# Patient Record
Sex: Male | Born: 1935 | Race: White | Hispanic: No | Marital: Married | Smoking: Former smoker
Health system: Southern US, Community
[De-identification: ages and names within clinical notes are randomized; demographics above are authoritative.]

## PROBLEM LIST (undated history)

## (undated) DIAGNOSIS — I10 Essential (primary) hypertension: Secondary | ICD-10-CM

## (undated) DIAGNOSIS — I2510 Atherosclerotic heart disease of native coronary artery without angina pectoris: Secondary | ICD-10-CM

## (undated) DIAGNOSIS — K5790 Diverticulosis of intestine, part unspecified, without perforation or abscess without bleeding: Secondary | ICD-10-CM

## (undated) DIAGNOSIS — Q438 Other specified congenital malformations of intestine: Secondary | ICD-10-CM

## (undated) DIAGNOSIS — E785 Hyperlipidemia, unspecified: Secondary | ICD-10-CM

## (undated) DIAGNOSIS — D369 Benign neoplasm, unspecified site: Secondary | ICD-10-CM

## (undated) DIAGNOSIS — C4490 Unspecified malignant neoplasm of skin, unspecified: Secondary | ICD-10-CM

## (undated) HISTORY — DX: Hyperlipidemia, unspecified: E78.5

## (undated) HISTORY — DX: Other specified congenital malformations of intestine: Q43.8

## (undated) HISTORY — DX: Benign neoplasm, unspecified site: D36.9

## (undated) HISTORY — DX: Diverticulosis of intestine, part unspecified, without perforation or abscess without bleeding: K57.90

## (undated) HISTORY — DX: Unspecified malignant neoplasm of skin, unspecified: C44.90

## (undated) HISTORY — DX: Atherosclerotic heart disease of native coronary artery without angina pectoris: I25.10

---

## 1992-04-18 HISTORY — PX: CORONARY ARTERY BYPASS GRAFT: SHX141

## 1999-12-29 ENCOUNTER — Encounter: Admission: RE | Admit: 1999-12-29 | Discharge: 1999-12-29 | Payer: Self-pay | Admitting: Cardiology

## 1999-12-29 ENCOUNTER — Encounter: Payer: Self-pay | Admitting: Cardiology

## 2000-01-04 ENCOUNTER — Ambulatory Visit (HOSPITAL_COMMUNITY): Admission: RE | Admit: 2000-01-04 | Discharge: 2000-01-04 | Payer: Self-pay | Admitting: Cardiology

## 2002-09-06 ENCOUNTER — Ambulatory Visit (HOSPITAL_COMMUNITY): Admission: RE | Admit: 2002-09-06 | Discharge: 2002-09-06 | Payer: Self-pay | Admitting: Family Medicine

## 2002-09-06 ENCOUNTER — Encounter: Payer: Self-pay | Admitting: Family Medicine

## 2002-10-31 ENCOUNTER — Ambulatory Visit (HOSPITAL_COMMUNITY): Admission: RE | Admit: 2002-10-31 | Discharge: 2002-10-31 | Payer: Self-pay | Admitting: Internal Medicine

## 2002-11-21 ENCOUNTER — Encounter: Payer: Self-pay | Admitting: Internal Medicine

## 2002-11-21 ENCOUNTER — Ambulatory Visit (HOSPITAL_COMMUNITY): Admission: RE | Admit: 2002-11-21 | Discharge: 2002-11-21 | Payer: Self-pay | Admitting: Internal Medicine

## 2003-09-16 ENCOUNTER — Ambulatory Visit (HOSPITAL_COMMUNITY): Admission: RE | Admit: 2003-09-16 | Discharge: 2003-09-16 | Payer: Self-pay | Admitting: Family Medicine

## 2004-07-08 ENCOUNTER — Ambulatory Visit (HOSPITAL_COMMUNITY): Admission: RE | Admit: 2004-07-08 | Discharge: 2004-07-08 | Payer: Self-pay | Admitting: Family Medicine

## 2004-08-12 ENCOUNTER — Ambulatory Visit (HOSPITAL_COMMUNITY): Admission: RE | Admit: 2004-08-12 | Discharge: 2004-08-12 | Payer: Self-pay | Admitting: Family Medicine

## 2007-01-19 ENCOUNTER — Ambulatory Visit (HOSPITAL_COMMUNITY): Admission: RE | Admit: 2007-01-19 | Discharge: 2007-01-19 | Payer: Self-pay | Admitting: Internal Medicine

## 2007-10-09 ENCOUNTER — Ambulatory Visit (HOSPITAL_COMMUNITY): Admission: RE | Admit: 2007-10-09 | Discharge: 2007-10-09 | Payer: Self-pay | Admitting: Family Medicine

## 2008-01-30 ENCOUNTER — Ambulatory Visit: Payer: Self-pay | Admitting: Cardiology

## 2008-04-18 DIAGNOSIS — Q438 Other specified congenital malformations of intestine: Secondary | ICD-10-CM

## 2008-04-18 DIAGNOSIS — D369 Benign neoplasm, unspecified site: Secondary | ICD-10-CM

## 2008-04-18 DIAGNOSIS — K5790 Diverticulosis of intestine, part unspecified, without perforation or abscess without bleeding: Secondary | ICD-10-CM

## 2008-04-18 HISTORY — DX: Diverticulosis of intestine, part unspecified, without perforation or abscess without bleeding: K57.90

## 2008-04-18 HISTORY — DX: Benign neoplasm, unspecified site: D36.9

## 2008-04-18 HISTORY — DX: Other specified congenital malformations of intestine: Q43.8

## 2008-10-10 ENCOUNTER — Encounter (INDEPENDENT_AMBULATORY_CARE_PROVIDER_SITE_OTHER): Payer: Self-pay | Admitting: *Deleted

## 2008-10-10 LAB — CONVERTED CEMR LAB
Albumin: 4.2 g/dL
BUN: 12 mg/dL
Bilirubin, Direct: 0.2 mg/dL
Bilirubin, Direct: 0.2 mg/dL
CO2: 28 meq/L
CO2: 28 meq/L
Calcium: 9.1 mg/dL
Cholesterol: 94 mg/dL
Creatinine, Ser: 1.14 mg/dL
Creatinine, Ser: 1.14 mg/dL
Free T4: 7.9 ng/dL
Free T4: 7.9 ng/dL
Glucose, Bld: 95 mg/dL
HDL: 48 mg/dL
LDL Cholesterol: 33 mg/dL
Platelets: 138 10*3/uL
Potassium: 4 meq/L
Sodium: 143 meq/L
Sodium: 143 meq/L
Total Protein: 6.5 g/dL

## 2008-10-16 ENCOUNTER — Encounter: Payer: Self-pay | Admitting: Internal Medicine

## 2008-10-29 ENCOUNTER — Encounter: Payer: Self-pay | Admitting: Internal Medicine

## 2008-10-29 ENCOUNTER — Ambulatory Visit (HOSPITAL_COMMUNITY): Admission: RE | Admit: 2008-10-29 | Discharge: 2008-10-29 | Payer: Self-pay | Admitting: Internal Medicine

## 2008-10-29 ENCOUNTER — Ambulatory Visit: Payer: Self-pay | Admitting: Internal Medicine

## 2008-10-29 HISTORY — PX: COLONOSCOPY: SHX174

## 2008-10-31 ENCOUNTER — Encounter: Payer: Self-pay | Admitting: Internal Medicine

## 2008-11-04 ENCOUNTER — Telehealth (INDEPENDENT_AMBULATORY_CARE_PROVIDER_SITE_OTHER): Payer: Self-pay | Admitting: *Deleted

## 2008-11-12 ENCOUNTER — Ambulatory Visit (HOSPITAL_COMMUNITY): Admission: RE | Admit: 2008-11-12 | Discharge: 2008-11-12 | Payer: Self-pay | Admitting: Internal Medicine

## 2008-11-19 ENCOUNTER — Encounter: Payer: Self-pay | Admitting: Internal Medicine

## 2008-12-09 ENCOUNTER — Encounter: Admission: RE | Admit: 2008-12-09 | Discharge: 2008-12-09 | Payer: Self-pay | Admitting: Internal Medicine

## 2008-12-09 HISTORY — PX: COLONOSCOPY: SHX174

## 2009-02-02 ENCOUNTER — Ambulatory Visit: Payer: Self-pay | Admitting: Cardiology

## 2009-02-02 DIAGNOSIS — I10 Essential (primary) hypertension: Secondary | ICD-10-CM | POA: Insufficient documentation

## 2009-02-02 DIAGNOSIS — I2510 Atherosclerotic heart disease of native coronary artery without angina pectoris: Secondary | ICD-10-CM

## 2009-02-02 DIAGNOSIS — E782 Mixed hyperlipidemia: Secondary | ICD-10-CM

## 2009-03-02 ENCOUNTER — Ambulatory Visit: Payer: Self-pay | Admitting: Cardiology

## 2009-04-06 ENCOUNTER — Ambulatory Visit: Payer: Self-pay | Admitting: Cardiology

## 2009-04-14 ENCOUNTER — Encounter: Payer: Self-pay | Admitting: Cardiology

## 2009-05-01 ENCOUNTER — Encounter (INDEPENDENT_AMBULATORY_CARE_PROVIDER_SITE_OTHER): Payer: Self-pay | Admitting: *Deleted

## 2009-05-06 ENCOUNTER — Ambulatory Visit: Payer: Self-pay | Admitting: Cardiology

## 2009-07-06 ENCOUNTER — Telehealth (INDEPENDENT_AMBULATORY_CARE_PROVIDER_SITE_OTHER): Payer: Self-pay

## 2009-07-24 ENCOUNTER — Encounter (INDEPENDENT_AMBULATORY_CARE_PROVIDER_SITE_OTHER): Payer: Self-pay

## 2009-08-04 ENCOUNTER — Encounter: Payer: Self-pay | Admitting: Cardiology

## 2009-08-05 LAB — CONVERTED CEMR LAB
AST: 19 units/L (ref 0–37)
Total Bilirubin: 0.6 mg/dL (ref 0.3–1.2)
Total Protein: 6.2 g/dL (ref 6.0–8.3)
VLDL: 13 mg/dL (ref 0–40)

## 2009-08-17 ENCOUNTER — Ambulatory Visit (HOSPITAL_COMMUNITY): Admission: RE | Admit: 2009-08-17 | Discharge: 2009-08-17 | Payer: Self-pay | Admitting: Family Medicine

## 2009-11-18 ENCOUNTER — Ambulatory Visit: Payer: Self-pay | Admitting: Cardiology

## 2009-11-19 ENCOUNTER — Encounter: Payer: Self-pay | Admitting: Cardiology

## 2010-02-09 ENCOUNTER — Encounter: Payer: Self-pay | Admitting: Cardiology

## 2010-02-09 LAB — CONVERTED CEMR LAB
Albumin: 4.4 g/dL (ref 3.5–5.2)
Cholesterol: 108 mg/dL (ref 0–200)
HDL: 43 mg/dL (ref >39)
Indirect Bilirubin: 0.3 mg/dL (ref 0.0–0.9)
LDL Cholesterol: 48 mg/dL (ref 0–99)
Total CHOL/HDL Ratio: 2.5
Total Protein: 6.5 g/dL (ref 6.0–8.3)
Triglycerides: 87 mg/dL (ref <150)
VLDL: 17 mg/dL (ref 0–40)

## 2010-05-18 NOTE — Progress Notes (Signed)
Summary: Medicines  Phone Note Call from Patient   Caller: Patient Reason for Call: Talk to Nurse Summary of Call: pt wants to speak with nurse regarding medicines being called in/pl call pt/tg Initial call taken by: Raechel Ache [facility],  July 06, 2009 10:48 AM  Follow-up for Phone Call        Pt. states that he only wants his refills when he request them. I advised him to call his phar. and ask them to only send out refill request at the begining of each month. He states he understands info. given. Follow-up by: Larita Fife Via LPN,  July 06, 2009 11:10 AM

## 2010-05-18 NOTE — Assessment & Plan Note (Signed)
Summary: A2Z   Visit Type:  Follow-up Primary Provider:  Dr. Butch Penny   History of Present Illness: 77 year old male presents for followup. He reports no problems with angina. Describes NYHA class II dyspnea on exertion.  Recent followup labs from April of this year showed cholesterol 94, triglycerides of 65, HDL 46, LDL 35, AST 19, ALT 15. Statin dosing was reduced. He had some question earlier about the possibility of urinary symptoms following a switch from Vytorin to simvastatin, however this did not turn out to be the case. He is now interested in switching back to simvastatin.  Current Medications (verified): 1)  Aspir-Low 81 Mg Tbec (Aspirin) .... Take 1 Tab Daily 2)  Daily-Vitamin  Tabs (Multiple Vitamin) .... Take 1 Tab Daily 3)  Mucinex D 60-600 Mg Xr12h-Tab (Pseudoephedrine-Guaifenesin) .... Take 1 Tab Daily 4)  Proair Hfa 108 (90 Base) Mcg/act Aers (Albuterol Sulfate) .... As Needed 5)  Amlodipine Besylate 10 Mg Tabs (Amlodipine Besylate) .... Take Two Tablets By Mouth Daily 6)  Labetalol Hcl 200 Mg Tabs (Labetalol Hcl) .... Take 1 Tablet By Mouth Two Times A Day 7)  Nitrostat 0.4 Mg Subl (Nitroglycerin) .Marland Kitchen.. 1 Tablet Under Tongue At Onset of Chest Pain; You May Repeat Every 5 Minutes For Up To 3 Doses. 8)  Simvastatin 10 Mg Tabs (Simvastatin) .... Take 1 Tablet By Mouth At Bedtime  Allergies (verified): No Known Drug Allergies  Past History:  Past Surgical History: Last updated: 02/02/2009 CABG - LIMA to LAD, SVG to first and second diagonal, SVG to OM, SVG to PDA  Social History: Last updated: 02/02/2009 Retired - [employer] Married  Tobacco Use - Former.  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  The patient denies anorexia, fever, chest pain, syncope, peripheral edema, melena, and hematochezia.         Otherwise reviewed and  negative except as outlined.  Vital Signs:  Patient profile:   77 year old male Weight:      157 pounds BMI:     24.68 Pulse rate:   68 / minute BP sitting:   157 / 82  (right arm)  Vitals Entered By: Dreama Saa, CNA (November 18, 2009 2:45 PM)  Physical Exam  Additional Exam:  Elderly male in no acute distress. HEENT: conjunctiva and lids normal, oropharynx with moist mucosa. Neck: Supple, without carotid bruits or thyromegaly. Lungs: Clear with diminished breath sounds. No wheezing. Nonlabored. Cardiac: Distant, regular heart sounds, soft basal systolic murmur, no S3 gallop. Abdomen: Soft, nontender, no bruit. Extremities: No pitting edema, distal pulses 1-2+. Skin: Warm and dry. Musculoskeletal: Mild kyphosis. Neuropsychiatric: Alert and oriented x3, affect normal.   EKG  Procedure date:  11/18/2009  Findings:      Sinus rhythm at 62 beats per minute with prolonged PR interval of 216 ms and IVCD with QRS duration 146 ms. Incomplete right bundle branch block pattern with evidence of previous anterior infarct.  Impression & Recommendations:  Problem # 1:  CORONARY ATHEROSCLEROSIS NATIVE CORONARY ARTERY (ICD-414.01)  His updated medication list for this problem includes:    Aspir-low 81 Mg Tbec (Aspirin) .Marland Kitchen... Take 1 tab daily    Amlodipine Besylate 10 Mg Tabs (Amlodipine besylate) .Marland Kitchen... Take two tablets by mouth daily    Labetalol Hcl 200 Mg Tabs (Labetalol hcl) .Marland Kitchen... Take 1 tablet by mouth two times  a day    Nitrostat 0.4 Mg Subl (Nitroglycerin) .Marland Kitchen... 1 tablet under tongue at onset of chest pain; you may repeat every 5 minutes for up to 3 doses.  His updated medication list for this problem includes:    Aspir-low 81 Mg Tbec (Aspirin) .Marland Kitchen... Take 1 tab daily    Amlodipine Besylate 10 Mg Tabs (Amlodipine besylate) .Marland Kitchen... Take two tablets by mouth daily    Labetalol Hcl 200 Mg Tabs (Labetalol hcl) .Marland Kitchen... Take 1 tablet by mouth two times a day    Nitrostat 0.4 Mg Subl  (Nitroglycerin) .Marland Kitchen... 1 tablet under tongue at onset of chest pain; you may repeat every 5 minutes for up to 3 doses.  Problem # 2:  MIXED HYPERLIPIDEMIA (ICD-272.2)  Agree with switch from Vytorin to simvastatin 10 mg p.o. q.h.s. Refills provided. Followup lipid profile and liver function tests at 12 weeks.  His updated medication list for this problem includes:    Simvastatin 10 Mg Tabs (Simvastatin) .Marland Kitchen... Take 1 tablet by mouth at bedtime  Future Orders: T-Lipid Profile ([id number]) ... 02/08/2010 T-Hepatic Function [id number]) ... 02/08/2010  Problem # 3:  ESSENTIAL HYPERTENSION, BENIGN (ICD-401.1)  Blood pressure elevated today.  His updated medication list for this problem includes:    Aspir-low 81 Mg Tbec (Aspirin) .Marland Kitchen... Take 1 tab daily    Amlodipine Besylate 10 Mg Tabs (Amlodipine besylate) .Marland Kitchen... Take two tablets by mouth daily    Labetalol Hcl 200 Mg Tabs (Labetalol hcl) .Marland Kitchen... Take 1 tablet by mouth two times a day  His updated medication list for this problem includes:    Aspir-low 81 Mg Tbec (Aspirin) .Marland Kitchen... Take 1 tab daily    Amlodipine Besylate 10 Mg Tabs (Amlodipine besylate) .Marland Kitchen... Take two tablets by mouth daily    Labetalol Hcl 200 Mg Tabs (Labetalol hcl) .Marland Kitchen... Take 1 tablet by mouth two times a day  Patient Instructions: 1)  Your physician recommends that you schedule a follow-up appointment in: 6 months 2)  Your physician recommends that you return for lab work in: 12 weeks 3)  Your physician has recommended you make the following change in your medication: stop taking Vytorin and start taking Simvastatin 10mg  by mouth once daily  Prescriptions: SIMVASTATIN 10 MG TABS (SIMVASTATIN) take 1 tablet by mouth at bedtime  #30 x 5   Entered by:   Larita Fife Via LPN   Authorized by:   Loreli Slot, MD, [facility]   Signed by:   Larita Fife Via LPN on 29/56/2130   Method used:   Electronically to        [facility] (retail)       [address]        [name], [place]  [address]       Ph: [phone]       Fax: [phone]   RxID:   [id number]

## 2010-05-18 NOTE — Miscellaneous (Signed)
Summary: Medications update  Clinical Lists Changes  Medications: Removed medication of SIMVASTATIN 20 MG TABS (SIMVASTATIN) take 1 tablet by mouth at bedtime - Signed Added new medication of VYTORIN 10-40 MG TABS (EZETIMIBE-SIMVASTATIN) Take 1/2 tablet by mouth once daily - Signed    Pt. states that he stopped taking Simvastatin due to Kidney infection and restarted Vytorin 10-40mg  (1/2 tablet once daily).

## 2010-05-18 NOTE — Miscellaneous (Signed)
Summary: LABS 10/10/2008 CMP,LIPIDS,TSH  Clinical Lists Changes  Observations: Added new observation of ALBUMIN: 4.2 g/dL (16/01/9603 54:09) Added new observation of PROTEIN, TOT: 6.5 g/dL (81/19/1478 29:56) Added new observation of SGPT (ALT): 15 units/L (10/10/2008 16:58) Added new observation of SGOT (AST): 20 units/L (10/10/2008 16:58) Added new observation of ALK PHOS: 48 units/L (10/10/2008 16:58) Added new observation of BILI DIRECT: 0.2 mg/dL (21/30/8657 84:69) Added new observation of CREATININE: 1.14 mg/dL (62/95/2841 32:44) Added new observation of BUN: 12 mg/dL (04/20/7251 66:44) Added new observation of BG RANDOM: 95 mg/dL (03/47/4259 56:38) Added new observation of CO2 PLSM/SER: 28 meq/L (10/10/2008 16:58) Added new observation of CL SERUM: 103 meq/L (10/10/2008 16:58) Added new observation of K SERUM: 4.0 meq/L (10/10/2008 16:58) Added new observation of NA: 143 meq/L (10/10/2008 16:58) Added new observation of LDL: 33 mg/dL (75/64/3329 51:88) Added new observation of HDL: 48 mg/dL (41/66/0630 16:01) Added new observation of TRIGLYC TOT: 67 mg/dL (09/32/3557 32:20) Added new observation of CHOLESTEROL: 94 mg/dL (25/42/7062 37:62) Added new observation of TSH: 1.881 microintl units/mL (10/10/2008 16:58) Added new observation of T4, FREE: 7.9 ng/dL (83/15/1761 60:73)

## 2010-05-18 NOTE — Assessment & Plan Note (Signed)
Summary: 3 mth f/u per checkout on 02/02/09/tg   Visit Type:  Follow-up Primary Provider:  Dr. Butch Penny   History of Present Illness: 77 year old male presents for a followup visit. He denies any significant angina or progressive shortness of breath. He has had blood pressure checks and medication adjustments since I last saw him. New medication regimen is outlined below. He presents a list of home blood pressure checks from last week showing quite good blood pressure control, typically with systolics ranging in the 110-120 range over diastolics in the 70-80 range. Heart rate in the 60s.  He asked about changing Vytorin to a generic medication. He still has an old bottle of nitroglycerin, not requiring them for several years. We discussed refilling this as well.   Current Medications (verified): 1)  Aspir-Low 81 Mg Tbec (Aspirin) .... Take 1 Tab Daily 2)  Daily-Vitamin  Tabs (Multiple Vitamin) .... Take 1 Tab Daily 3)  Mucinex D 60-600 Mg Xr12h-Tab (Pseudoephedrine-Guaifenesin) .... Take 1 Tab Daily 4)  Proair Hfa 108 (90 Base) Mcg/act Aers (Albuterol Sulfate) .... As Needed 5)  Amlodipine Besylate 10 Mg Tabs (Amlodipine Besylate) .... Take Two Tablets By Mouth Daily 6)  Labetalol Hcl 200 Mg Tabs (Labetalol Hcl) .... Take 1 Tablet By Mouth Two Times A Day 7)  Simvastatin 20 Mg Tabs (Simvastatin) .... Take 1 Tablet By Mouth At Bedtime 8)  Nitrostat 0.4 Mg Subl (Nitroglycerin) .Marland Kitchen.. 1 Tablet Under Tongue At Onset of Chest Pain; You May Repeat Every 5 Minutes For Up To 3 Doses.  Allergies (verified): No Known Drug Allergies  Past History:  Past Medical History: Last updated: 02/02/2009 CAD - LVEF 60% Hyperlipidemia Hypertension  Past Surgical History: Last updated: 02/02/2009 CABG - LIMA to LAD, SVG to first and second diagonal, SVG to OM, SVG to PDA  Social History: Last updated: 02/02/2009 Retired - [employer] Married  Tobacco Use - Former.  Alcohol Use - no  Review of  Systems  The patient denies anorexia, fever, weight loss, chest pain, syncope, peripheral edema, prolonged cough, headaches, hemoptysis, melena, and hematochezia.         Otherwise reviewed and negative.  Vital Signs:  Patient profile:   77 year old male Weight:      159 pounds Pulse rate:   60 / minute BP sitting:   142 / 78  (right arm)  Vitals Entered By: Dreama Saa, CNA (May 06, 2009 11:34 AM)  Physical Exam  Additional Exam:  Elderly male in no acute distress. HEENT: conjunctiva and lids normal, oropharynx with moist mucosa. Neck: Supple, without carotid bruits or thyromegaly. Lungs: Clear with diminished breath sounds. No wheezing. Nonlabored. Cardiac: Distant, regular heart sounds, soft basal systolic murmur, no S3 gallop. Abdomen: Soft, nontender, no bruit. Extremities: No pitting edema, distal pulses 1-2+. Skin: Warm and dry. Musculoskeletal: Mild kyphosis. Neuropsychiatric: Alert and oriented x3, affect normal.   Impression & Recommendations:  Problem # 1:  CORONARY ATHEROSCLEROSIS NATIVE CORONARY ARTERY (ICD-414.01)  No active anginal symptoms on present medical regimen. Refill provided for sublingual nitroglycerin p.r.n. Follow up will be arranged in 6 months.  His updated medication list for this problem includes:    Aspir-low 81 Mg Tbec (Aspirin) .Marland Kitchen... Take 1 tab daily    Amlodipine Besylate 10 Mg Tabs (Amlodipine besylate) .Marland Kitchen... Take two tablets by mouth daily    Labetalol Hcl 200 Mg Tabs (Labetalol hcl) .Marland Kitchen... Take 1 tablet by mouth two times a day    Nitrostat 0.4 Mg Subl (Nitroglycerin) .Marland KitchenMarland KitchenMarland KitchenMarland Kitchen  1 tablet under tongue at onset of chest pain; you may repeat every 5 minutes for up to 3 doses.  His updated medication list for this problem includes:    Aspir-low 81 Mg Tbec (Aspirin) .Marland Kitchen... Take 1 tab daily    Amlodipine Besylate 10 Mg Tabs (Amlodipine besylate) .Marland Kitchen... Take two tablets by mouth daily    Labetalol Hcl 200 Mg Tabs (Labetalol hcl) .Marland Kitchen... Take 1  tablet by mouth two times a day    Nitrostat 0.4 Mg Subl (Nitroglycerin) .Marland Kitchen... 1 tablet under tongue at onset of chest pain; you may repeat every 5 minutes for up to 3 doses.  Problem # 2:  ESSENTIAL HYPERTENSION, BENIGN (ICD-401.1)  Blood pressure trend is much better in general. Norvasc dosing is a bit unconventional, however the patient is tolerating this with positive effects. I asked that he continue on his labetalol and Norvasc. He will continue to check his blood pressure at home.  His updated medication list for this problem includes:    Aspir-low 81 Mg Tbec (Aspirin) .Marland Kitchen... Take 1 tab daily    Amlodipine Besylate 10 Mg Tabs (Amlodipine besylate) .Marland Kitchen... Take two tablets by mouth daily    Labetalol Hcl 200 Mg Tabs (Labetalol hcl) .Marland Kitchen... Take 1 tablet by mouth two times a day  Problem # 3:  MIXED HYPERLIPIDEMIA (ICD-272.2)  Will discontinued Vytorin and initiate simvastatin 20 mg p.o. q.h.s. Followup lipid profile and liver function tests and 12 weeks.  The following medications were removed from the medication list:    Vytorin 10-40 Mg Tabs (Ezetimibe-simvastatin) .Marland Kitchen... Take 1/2 tab daily His updated medication list for this problem includes:    Simvastatin 20 Mg Tabs (Simvastatin) .Marland Kitchen... Take 1 tablet by mouth at bedtime  Future Orders: T-Lipid Profile ([id number]) ... 08/04/2009 T-Hepatic Function [id number]) ... 08/04/2009  Patient Instructions: 1)  Your physician recommends that you schedule a follow-up appointment in: 6 months 2)  Your physician recommends that you return for lab work in: 12 weeks (will mail orders to you in April) 3)  Your physician has recommended you make the following change in your medication: Stop taking Vytorin and start taking Simvastatin 20mg  by mouth at bedtime  Prescriptions: NITROSTAT 0.4 MG SUBL (NITROGLYCERIN) 1 tablet under tongue at onset of chest pain; you may repeat every 5 minutes for up to 3 doses.  #25 x 3   Entered by:   Larita Fife Via LPN    Authorized by:   Loreli Slot, MD, [facility]   Signed by:   Larita Fife Via LPN on 63/87/5643   Method used:   Electronically to        [facility] (retail)       [address]       [place], [place]  [address]       Ph: [phone]       Fax: [phone]   RxID:   [id number] SIMVASTATIN 20 MG TABS (SIMVASTATIN) take 1 tablet by mouth at bedtime  #30 x 6   Entered by:   Larita Fife Via LPN   Authorized by:   Loreli Slot, MD, [facility]   Signed by:   Larita Fife Via LPN on 62/83/1517   Method used:   Electronically to        [facility] (retail)       [address]       [place], [place]  [address]       Ph: [phone]       Fax: [phone]   RxID:   [id number]

## 2010-05-18 NOTE — Letter (Signed)
Summary: [place] [facility] Work Engineer, agricultural at [employer]  [address] [place] [address]   Phone: [phone]  Fax: [phone]     November 18, 2009 MRN: [id number]   Jay Lester [address] [place], [place]  [address]      YOUR LAB WORK IS DUE  The week of February 08, 2010 _________________________________________  Please go to [facility], located across the street from [facility] on the second floor.  Hours are Monday - Friday 7am until 7:30pm         Saturday 8am until 12noon    _X_  DO NOT EAT OR DRINK AFTER MIDNIGHT EVENING PRIOR TO LABWORK  __ YOUR LABWORK IS NOT FASTING --YOU MAY EAT PRIOR TO LABWORK

## 2010-05-18 NOTE — Letter (Signed)
Summary: [place] Results Engineer, agricultural at [facility]  [address] [place] [address]   Phone: [phone]  Fax: [phone]      February 09, 2010 MRN: [id number]   JASHAN COTTEN [address] [place], [place]  [address]   Dear Mr. KENLEY,  Your test ordered by Selena Batten has been reviewed by your physician (or physician assistant) and was found to be normal or stable. Your physician (or physician assistant) felt no changes were needed at this time.  ____ Echocardiogram  ____ Cardiac Stress Test  __X__ Lab Work  ____ Peripheral vascular study of arms, legs or neck  ____ CT scan or X-ray  ____ Lung or Breathing test  ____ Other:  Please continue on current medical treatment.   Thank you.   Nona Dell, MD, F.A.C.C

## 2010-05-18 NOTE — Letter (Signed)
Summary: [place] [facility] Work Engineer, agricultural at [employer]  [address] [place] [address]   Phone: [phone]  Fax: [phone]     May 06, 2009 MRN: [id number]   Jay Lester [address] [place], [place]  [address]      YOUR LAB WORK IS DUE  August 04, 2009 _________________________________________  Please go to [facility], located across the street from [facility] on the second floor.  Hours are Monday - Friday 7am until 7:30pm         Saturday 8am until 12noon    _X_  DO NOT EAT OR DRINK AFTER MIDNIGHT EVENING PRIOR TO LABWORK  __ YOUR LABWORK IS NOT FASTING --YOU MAY EAT PRIOR TO LABWORK

## 2010-05-21 ENCOUNTER — Encounter: Payer: Self-pay | Admitting: Cardiology

## 2010-05-21 ENCOUNTER — Ambulatory Visit (INDEPENDENT_AMBULATORY_CARE_PROVIDER_SITE_OTHER): Payer: BC Managed Care – PPO | Admitting: Cardiology

## 2010-05-21 ENCOUNTER — Ambulatory Visit: Admit: 2010-05-21 | Payer: Self-pay | Admitting: Cardiology

## 2010-05-21 DIAGNOSIS — I10 Essential (primary) hypertension: Secondary | ICD-10-CM

## 2010-05-21 DIAGNOSIS — I2510 Atherosclerotic heart disease of native coronary artery without angina pectoris: Secondary | ICD-10-CM

## 2010-05-21 DIAGNOSIS — E782 Mixed hyperlipidemia: Secondary | ICD-10-CM

## 2010-05-21 DIAGNOSIS — R0602 Shortness of breath: Secondary | ICD-10-CM | POA: Insufficient documentation

## 2010-05-24 ENCOUNTER — Encounter: Payer: Self-pay | Admitting: Cardiology

## 2010-05-26 NOTE — Letter (Signed)
Summary: [place] [facility]read[facility] (Nuc Med Stress)  [facility] [employer] [facility]  [address], [place] [address]   Phone: [phone]  Fax: [phone]    Nuclear Medicine 1-Day Stress Test Information Sheet  Re:     Jay Lester   DOB:     26-Jun-1923 MRN:     [id number] Weight:  Appointment Date: Register at: Appointment Time: Referring MD:  ___Exercise Stress  __Adenosine   __Dobutamine  _X_Lexiscan  __Persantine   __Thallium  Urgency: ____1 (next day)   ____2 (one week)    ____3 (PRN)  Patient will receive Follow Up call with results: Patient needs follow-up appointment:  Instructions regarding medication:  How to prepare for your stress test: 1. DO NOT not eat or drink after midnight the night before test. This includes no caffeine (coffee, tea, sodas, chocolate) if you were instructed to take your medications, drink water with it. 2. DO NOT use any tobacco products for at leaset 8 hours prior to arrival. 3. DO NOT wear dresses or any clothing that may have metal clasps or buttons. 4. Wear short sleeve shirts, loose clothing, and comfortalbe walking shoes. 5. DO NOT use lotions, oils or powder on your chest before the test. 6. The test will take approximately 3-4 hours from the time you arrive until completion. 7. To register the day of the test, go to the Short Stay entrance at [facility]. 8. If you must cancel your test, call [phone] as soon as you are aware.  After you arrive for test:   [facility] at [facility], you will go to Short Stay to be registered. They will then send you to Radiology to check in. The Nuclear Medicine Tech will get you and start an IV in your arm or hand. A small amount of a radioactive tracer will then be injected into your IV. This tracer will then have to circulate for 30-45 minutes. During this time you will wait in the waiting room and you will be able to drink something without caffeine. A series of pictures will be  taken of your heart follwoing this waiting period. After the 1st set of pictures you will go to the stress lab to get ready for your stress test. During the stress test, another small amount of a radioactive tracer will be injected through your IV. When the stress test is complete, there is a short rest period while your heart rate and blood pressure will be monitored. When this monitoring period is complete you will have another set of pictrues taken. (The same as the 1st set of pictures). These pictures are taken between 15 minutes and 1 hour after the stress test. The time depends on the type of stress test you had. Your doctor will inform you of your test results within 7 days after test.    The possibilities of certain changes are possible during the test. They include abnormal blood pressure and disorders of the heart. Side effects of persantine or adenosine can include flushing, chest pain, shortness of breath, stomach tightness, headache and light-headedness. These side effects usually do not last long and are self-resolving. Every effort will be made to keep you comfortable and to minimize complications by obtaining a medical history and by close observation during the test. Emergency equipment, medications, and trained personnel are available to deal with any unusual situation which may arise.  Please notify office at least 48 hours in advance if you are unable to keep  this appt.

## 2010-05-26 NOTE — Letter (Signed)
Summary: [place] [facility] Work Engineer, agricultural at [employer]  [address], [place] [address]   Phone: [phone]  Fax: [phone]     May 21, 2010 MRN: [id number]   Jay Lester [address] [place], [place]  [address]      YOUR LAB WORK IS DUE  November 15, 2010 _________________________________________  Please go to [facility], located across the street from [facility] on the second floor.  Hours are Monday - Friday 7am until 7:30pm         Saturday 8am until 12noon    _X_  DO NOT EAT OR DRINK AFTER MIDNIGHT EVENING PRIOR TO LABWORK  __ YOUR LABWORK IS NOT FASTING --YOU MAY EAT PRIOR TO LABWORK

## 2010-05-26 NOTE — Assessment & Plan Note (Signed)
Summary: 6 mth f/u per checkout on 11/18/09/tg/sch per pt call/tmj/tg   Vital Signs:  Patient profile:   77 year old male Weight:      158 pounds BMI:     24.84 Pulse rate:   57 / minute BP sitting:   135 / 79  (left arm)  Vitals Entered By: Dreama Saa, CNA (May 21, 2010 8:25 AM)  Visit Type:  Follow-up Primary Provider:  Dr. Butch Penny   History of Present Illness: 77 year old male presents for followup. He was seen back in August 2011.  Lab work from October 2011 showed cholesterol 108, triglycerides 87, HDL 43, LDL 48, AST 18, ALT 14. We reviewed these today.  He underwent a Myoview in September 2008 through [facility] which demonstrated mild anterolateral ischemia towards the base with an ejection fraction of 60%. Last angiogram in 2001 revealed severe native cardiovascular disease with patent bypass grafts including a LIMA to the left anterior descending, SVG to PDA, SVG to OM, and SVG to first and second diagonal.  He reports no progressive angina, dyspnea on exertion NYHA class II, occasionally worse if he "pushes it." No palpitations, dizziness, or syncope.  States that he has been taking his labetalol only once daily related to relative hypotension reportedly.  ECG shows sinus bradycardia at 52 beats per minute with prolonged PR interval 236 ms, right bundle branch block pattern, evidence of previous anterior infarct.  Current Medications (verified): 1)  Aspir-Low 81 Mg Tbec (Aspirin) .... Take 1 Tab Daily 2)  Daily-Vitamin  Tabs (Multiple Vitamin) .... Take 1 Tab Daily 3)  Mucinex D 60-600 Mg Xr12h-Tab (Pseudoephedrine-Guaifenesin) .... Take 1 Tab Daily 4)  Proair Hfa 108 (90 Base) Mcg/act Aers (Albuterol Sulfate) .... As Needed 5)  Amlodipine Besylate 10 Mg Tabs (Amlodipine Besylate) .... Take 1 Tab Daily 6)  Toprol Xl 25 Mg Xr24h-Tab (Metoprolol Succinate) .... Take 1/2 Tablet By Mouth Once Daily 7)  Nitrostat 0.4 Mg Subl (Nitroglycerin)  .Marland Kitchen.. 1 Tablet Under Tongue At Onset of Chest Pain; You May Repeat Every 5 Minutes For Up To 3 Doses. 8)  Simvastatin 10 Mg Tabs (Simvastatin) .... 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  Alcohol Use - no  Review of Systems       The patient complains of dyspnea on exertion.  The patient denies anorexia, fever, weight loss, chest pain, syncope, peripheral edema, prolonged cough, abdominal pain, melena, and hematochezia.         Otherwise reviewed and negative except as outlined.  Physical Exam  Additional Exam:  Elderly male in no acute distress. HEENT: conjunctiva and lids normal, oropharynx with moist mucosa. Neck: Supple, without carotid bruits or thyromegaly. Lungs: Clear with diminished breath sounds. No wheezing. Nonlabored. Cardiac: Distant, regular heart sounds, soft basal systolic murmur, no S3 gallop. Abdomen: Soft, nontender, no bruit. Extremities: No pitting edema, distal pulses 1-2+. Skin: Warm and dry. Musculoskeletal: Mild kyphosis. Neuropsychiatric: Alert and oriented x3, affect normal.   Impression & Recommendations:  Problem # 1:  CORONARY  ATHEROSCLEROSIS NATIVE CORONARY ARTERY (ICD-414.01)  Relatively stable symptomatically. Plan to discontinue labetalol since he is not tolerating it twice daily dosing, and start low-dose Toprol-XL 12.5 mg daily. Otherwise continue present medications. He does have dyspnea on exertion. Last ischemic workup was in September 2008. ECG is  however stable. Prior to his next visit in 6 months we will schedule a followup Lexiscan Myoview, sooner if his symptoms progress in the interim.  His updated medication list for this problem includes:    Aspir-low 81 Mg Tbec (Aspirin) .Marland Kitchen... Take 1 tab daily    Amlodipine Besylate 10 Mg Tabs (Amlodipine besylate) .Marland Kitchen... Take 1 tab daily    Toprol Xl 25 Mg Xr24h-tab (Metoprolol succinate) .Marland Kitchen... Take 1/2 tablet by mouth once daily    Nitrostat 0.4 Mg Subl (Nitroglycerin) .Marland Kitchen... 1 tablet under tongue at onset of chest pain; you may repeat every 5 minutes for up to 3 doses.  Orders: Nuclear Stress Test (Nuc Stress Test)  Problem # 2:  MIXED HYPERLIPIDEMIA (ICD-272.2)  Lipids are aggressively controlled. Followup fasting lipid profile and liver function tests for next visit.  His updated medication list for this problem includes:    Simvastatin 10 Mg Tabs (Simvastatin) .Marland Kitchen... Take 1 tablet by mouth at bedtime  Future Orders: T-Lipid Profile ([id number]) ... 11/15/2010 T-Hepatic Function [id number]) ... 11/15/2010  Problem # 3:  ESSENTIAL HYPERTENSION, BENIGN (ICD-401.1)  Medication adjustments being made.  His updated medication list for this problem includes:    Aspir-low 81 Mg Tbec (Aspirin) .Marland Kitchen... Take 1 tab daily    Amlodipine Besylate 10 Mg Tabs (Amlodipine besylate) .Marland Kitchen... Take 1 tab daily    Toprol Xl 25 Mg Xr24h-tab (Metoprolol succinate) .Marland Kitchen... Take 1/2 tablet by mouth once daily  Patient Instructions: 1)  Your physician recommends that you schedule a follow-up appointment in: 6 months 2)  Your physician recommends that you return for lab work in: 6 months just before next visit 3)  Your physician has recommended you make the following change in your medication: Change Lebatolol to Toprol 25mg  (take 1/2 tablet) by mouth once daily  4)  Your physician has requested that you have an [employer]  For further information please visit [email or web address]  Please follow  instruction sheet, as given. Prescriptions: TOPROL XL 25 MG XR24H-TAB (METOPROLOL SUCCINATE) take 1 tablet by mouth once daily  #30 x 6   Entered by:   Larita Fife Via LPN   Authorized by:   Loreli Slot, MD, [facility]   Signed by:   Larita Fife Via LPN on 84/69/6295   Method used:   Electronically to        [facility] (retail)       [address]       [place], [place]  [address]       Ph: [phone]       Fax: [phone]   RxID:   [id number]  [facility] phar. notified and asked to change directions on  Toprol to Take 1/2 tablet (12.5mg  daily). Larita Fife Via LPN  May 21, 2010 8:58 AM    Orders Added: 1)  T-Lipid Profile [[id number]] 2)  T-Hepatic Function [phone] 3)  Nuclear Stress Test [Nuc Stress Test]

## 2010-07-20 ENCOUNTER — Other Ambulatory Visit: Payer: Self-pay

## 2010-07-20 ENCOUNTER — Other Ambulatory Visit: Payer: Self-pay | Admitting: Cardiology

## 2010-07-20 DIAGNOSIS — I10 Essential (primary) hypertension: Secondary | ICD-10-CM

## 2010-07-20 MED ORDER — AMLODIPINE BESYLATE 10 MG PO TABS: 10.0000 mg | ORAL_TABLET | Freq: Every day | ORAL | Status: DC

## 2010-08-20 ENCOUNTER — Telehealth: Payer: Self-pay | Admitting: Cardiology

## 2010-08-20 MED ORDER — SIMVASTATIN 10 MG PO TABS: 10.0000 mg | ORAL_TABLET | Freq: Every day | ORAL | Status: DC

## 2010-08-20 MED ORDER — AMLODIPINE BESYLATE 10 MG PO TABS: 10.0000 mg | ORAL_TABLET | Freq: Every day | ORAL | Status: DC

## 2010-08-20 NOTE — Telephone Encounter (Signed)
PT WALKED IN NEEDS A RX FOR SIMVASTSTIN 10MG  #30 CALLED IN TO [facility] HE IS OUT. PT WOULD LIKE [facility] TO CALL HIM WHEN IT IS READY.

## 2010-08-23 ENCOUNTER — Other Ambulatory Visit: Payer: Self-pay | Admitting: *Deleted

## 2010-08-23 MED ORDER — SIMVASTATIN 10 MG PO TABS: 10.0000 mg | ORAL_TABLET | Freq: Every day | ORAL | Status: DC

## 2010-08-31 NOTE — Op Note (Signed)
Jay Lester, Jay Lester               ACCOUNT NO.:  [id number]   MEDICAL RECORD NO.:  [id number]          PATIENT TYPE:  AMB   LOCATION:  DAY                           FACILITY:  [facility]   PHYSICIAN:  R. Roetta Sessions, M.D. DATE OF BIRTH:  03-31-1924   DATE OF PROCEDURE:  10/29/2008  DATE OF DISCHARGE:                                PROCEDURE NOTE   PROCEDURE PERFORMED:  Incomplete colonoscopy with snare polypectomy and  biopsy.   INDICATIONS FOR PROCEDURE:  An 77 year-old gentleman with history of  colonic adenomas.  Last colonoscopy some 5 years ago.  He does not have  any lower GI symptoms.  The colonoscopy was incomplete last year due to  redundancy of the colon.  It was complemented by air contrast barium  enema.  The procedure today has been discussed with the patient at  length.  Risks, benefits and alternatives and limitations have been  placed into documentation on the medical record.   PROCEDURE NOTE:  O2 saturation, blood pressure, pulse respirations  monitored throughout the entire procedure.  Conscious sedation:  Versed  7 mg IV, Demerol 75 mg IV in divided doses.   INSTRUMENT:  Pentax video chip system.   FINDINGS:  Digital and rectal exam revealed no abnormalities.  The prep  was adequate.  Colon:  The colon was again noted to be elongated and redundant.  I  advanced the scope well into what I felt was the transverse colon but  then encountered recurrent looping and redundancy.  The patient had  scattered diverticula.  The patient had a 5 mm polyp on a stalk in the  descending colon and a couple of diminutive polyps in what I felt was  the transverse colon which were  cold biopsied.  The 5 mm polyp in the  descending colon was cold snared.  I spent quite a bit of time trying to  advance the scope to the cecum using a combination of external abdominal  pressure and changing of the patient's position but was not able to  advance.  I never saw the ileocecal valve or cecum.   The colonic mucosa  was seen and was cleared of polyps and again scattered diverticula were  noted.  The scope was pulled down into the rectum where a thorough  examination of the rectal mucosa including retroflexion view of the anal  verge demonstrated no abnormalities.  The patient tolerated the  procedure well.   Redundant elongated colon precluded complete examination status post  biopsy and polypectomy.  The patient had diverticulosis.   RECOMMENDATIONS:  Will followup on pathology.  Will obtain an air  contrast barium enema to make sure there are no residual lesions up  stream.  Depending on the pathology of the polyps given Mr. Harju  advanced age, this may be all of the surveillance maneuvers we need to  do.  Will make further recommendations in the very near future.      Jonathon Bellows, M.D.  Electronically Signed     RMR/MEDQ  D:  10/29/2008  T:  10/29/2008  Job:  [id number]  cc:   Angus G. Renard Matter, MD  Fax: [phone]

## 2010-08-31 NOTE — Assessment & Plan Note (Signed)
[facility]                       [place] CARDIOLOG[facility] OFFICE NOTE   JAYMEN, FETCH                        MRN:          [id number]  DATE:01/30/2008                            DOB:          [birth year]    PRIMARY CARE PHYSICIAN:  Angus G. Renard Matter, MD   REASON FOR VISIT:  Established cardiology followup.   HISTORY OF PRESENT ILLNESS:  Mr. Gerling is a very pleasant 77 year old  gentleman previously followed by Dr. Clarene Duke with [facility] in [place].  He has a history of multivessel cardiovascular  disease status post coronary artery bypass grafting by Dr. Tyrone Sage  approximately 15 years ago.  He last saw Dr. Clarene Duke on December 27, 2006, for a routine visit and we await records regarding interval  cardiac testing over the years.  Clinically, Mr. Hunzeker has had some  difficulty with hypertension and had undergone some medication  adjustments, recently including Bystolic which he seems to be  tolerating.  He denies specifically having any problems with angina and  has NYHA class II dyspnea on exertion.  Today's electrocardiogram shows  sinus bradycardia at 50 beats per minute with evidence of a previous  anterior wall myocardial infarction and right bundle-branch block  pattern which I presume is old.  I have a copied tracing showing similar  changes from early October, but nothing more remote for comparison.  Recent labs from June indicate LDL of 49, HDL 47, AST 21, and ALT of 17.  Hemoglobin was 14 at that time, and his platelet count was 181.  BUN and  creatinine were 11 and 1.1, respectively.  He states that he does his  activities of daily living without difficulty and sometimes walks up to  a mile at a time with his wife without stopping.  He has had some  trouble recently with cough/bronchitis, but no fevers or chills.   ALLERGIES:  No known drug allergies.   PRESENT MEDICATIONS:  1. Enteric-coated aspirin 325 mg  p.o. daily.  2. Triamterene/hydrochlorothiazide 37.5/25 mg p.o. daily.  3. Albuterol 2 puffs q.4 history.  4. Vytorin 10/40 mg p.o. daily.  5. Protegra.  6. Multivitamin daily.  7. Bystolic 10 mg p.o. daily.  8. Mucinex p.r.n.   He has no current sublingual nitroglycerin.   PAST MEDICAL HISTORY:  In addition to that outlined above includes long-  standing hypertension and hyperlipidemia.  He denies any other major  surgeries.   SOCIAL HISTORY:  The patient is married.  He is retired, previously  worked at [employer], has a remote tobacco use history, but quit  approximately 15 years ago.  No regular alcohol use.   FAMILY HISTORY:  Reviewed and largely noncontributory.  He is not aware  of any particular medical history regarding his parents.  He has three  brothers, one of which died with a myocardial infarction.  He has a  sister who died with stroke.  He also has history of colon cancer in 2  of his brothers   REVIEW OF SYSTEMS:  As outlined above.  He has a history of cataracts,  does  have some hearing loss.  He has had some urinary frequency, no  dysuria, however, or incontinence.  No obvious melena or hematochezia.  He complains of arthritic pain, predominantly involving his ankles and  feet.  He has had some recent bronchitis.   PHYSICAL EXAMINATION:  VITAL SIGNS:  Blood pressure is 138/88, heart  rate is 50, weight is 163 pounds.  GENERAL:  This is a well-nourished, elderly male in no acute distress.  HEENT:  Conjunctiva is normal.  Oropharynx clear.  NECK:  Supple.  No elevated jugular venous pressure.  No loud carotid  bruits  No thyromegaly.  LUNGS:  Clear without labored breathing at rest.  Diminished breath  sounds noted.  CARDIAC:  A regular rate and rhythm.  Soft systolic murmur at the base.  Second heart sound is preserved.  No pericardial rub or S3 gallop.  ABDOMEN:  Soft, nontender.  No pulsatile mass or bruit.  Bowel sounds  are present.  No guarding  noted.  EXTREMITIES:  Exhibit no significant pitting edema.  Distal pulses are 1-  2+.  SKIN:  Warm and dry.  MUSCULOSKELETAL:  Mild kyphosis noted.  NEUROPSYCHIATRIC:  The patient is alert and oriented x3.  Affect is  appropriate.   IMPRESSION AND RECOMMENDATIONS:  1. History of described multivessel cardiovascular disease status post      coronary artery bypass grafting approximately 15 years ago.  I have      no other records at hand for review at this time.  His medical      regimen is reasonable.  I provided him a prescription for      sublingual nitroglycerin p.r.n.  He seems to be tolerating Bystolic      at this time, and I did not make any other specific adjustments.      We will obtain records from Dr. Clarene Duke regarding interval ischemic      testing over the years and also baseline ejection fraction.  His      electrocardiogram suggests probable old anterior wall myocardial      infarction, although the patient is not aware of any specific heart      attack over the years.  He is symptomatically stable at this time,      and we will anticipate an annual followup unless the situation      changes.  2. Hyperlipidemia, well controlled on present dose of Vytorin based on      labs from June.  We would aim for an LDL around 70.  3. Hypertension, recently problematic, although with medication      adjustments this seems to be improving.  He is tolerating Bystolic      and triamterene/hydrochlorothiazide at this point.     Jonelle Sidle, MD  Electronically Signed    SGM/MedQ  DD: 01/30/2008  DT: 01/31/2008  Job #: [id number]

## 2010-10-19 ENCOUNTER — Other Ambulatory Visit: Payer: Self-pay | Admitting: Cardiology

## 2010-10-19 ENCOUNTER — Encounter: Payer: Self-pay | Admitting: *Deleted

## 2010-10-19 LAB — LIPID PANEL
Cholesterol: 122 mg/dL (ref 0–200)
HDL: 44 mg/dL (ref >39)
LDL Cholesterol: 60 mg/dL (ref 0–99)
Total CHOL/HDL Ratio: 2.8 Ratio
Triglycerides: 92 mg/dL (ref <150)
VLDL: 18 mg/dL (ref 0–40)

## 2010-10-19 LAB — HEPATIC FUNCTION PANEL
Indirect Bilirubin: 0.7 mg/dL (ref 0.0–0.9)
Total Bilirubin: 0.9 mg/dL (ref 0.3–1.2)
Total Protein: 6.8 g/dL (ref 6.0–8.3)

## 2010-10-21 ENCOUNTER — Other Ambulatory Visit: Payer: Self-pay | Admitting: Cardiology

## 2010-10-26 ENCOUNTER — Encounter (HOSPITAL_COMMUNITY): Admission: RE | Admit: 2010-10-26 | Payer: Medicare Other | Source: Ambulatory Visit

## 2010-10-26 ENCOUNTER — Encounter (HOSPITAL_COMMUNITY): Payer: Self-pay | Admitting: Cardiology

## 2010-10-26 ENCOUNTER — Encounter (HOSPITAL_COMMUNITY): Payer: Self-pay

## 2010-10-26 ENCOUNTER — Encounter (HOSPITAL_COMMUNITY): Payer: Medicare Other

## 2010-10-26 ENCOUNTER — Ambulatory Visit (INDEPENDENT_AMBULATORY_CARE_PROVIDER_SITE_OTHER): Payer: Medicare Other | Admitting: *Deleted

## 2010-10-26 ENCOUNTER — Encounter (HOSPITAL_COMMUNITY)
Admission: RE | Admit: 2010-10-26 | Discharge: 2010-10-26 | Disposition: A | Discharge status: Home / Self Care | Payer: Medicare Other | Source: Ambulatory Visit | Attending: Cardiology | Admitting: Cardiology

## 2010-10-26 DIAGNOSIS — I10 Essential (primary) hypertension: Secondary | ICD-10-CM

## 2010-10-26 DIAGNOSIS — R0602 Shortness of breath: Secondary | ICD-10-CM

## 2010-10-26 DIAGNOSIS — I2510 Atherosclerotic heart disease of native coronary artery without angina pectoris: Secondary | ICD-10-CM | POA: Insufficient documentation

## 2010-10-26 DIAGNOSIS — R0609 Other forms of dyspnea: Secondary | ICD-10-CM

## 2010-10-26 DIAGNOSIS — R079 Chest pain, unspecified: Secondary | ICD-10-CM | POA: Insufficient documentation

## 2010-10-26 HISTORY — DX: Essential (primary) hypertension: I10

## 2010-10-26 MED ORDER — TECHNETIUM TC 99M TETROFOSMIN IV KIT
30.0000 | PACK | Freq: Once | INTRAVENOUS | Status: AC | PRN
  Administered 2010-10-26: 30 via INTRAVENOUS

## 2010-10-26 MED ORDER — TECHNETIUM TC 99M TETROFOSMIN IV KIT
10.0000 | PACK | Freq: Once | INTRAVENOUS | Status: AC | PRN
  Administered 2010-10-26: 9 via INTRAVENOUS

## 2010-10-26 NOTE — Progress Notes (Deleted)

## 2010-11-04 ENCOUNTER — Telehealth: Payer: Self-pay | Admitting: Cardiology

## 2010-11-04 NOTE — Telephone Encounter (Signed)
Results of myoview/tg

## 2010-11-18 ENCOUNTER — Encounter: Payer: Self-pay | Admitting: Cardiology

## 2010-12-01 ENCOUNTER — Encounter: Payer: Self-pay | Admitting: Cardiology

## 2010-12-01 ENCOUNTER — Ambulatory Visit (INDEPENDENT_AMBULATORY_CARE_PROVIDER_SITE_OTHER): Payer: Medicare Other | Admitting: Cardiology

## 2010-12-01 DIAGNOSIS — I2510 Atherosclerotic heart disease of native coronary artery without angina pectoris: Secondary | ICD-10-CM

## 2010-12-01 DIAGNOSIS — E782 Mixed hyperlipidemia: Secondary | ICD-10-CM

## 2010-12-01 DIAGNOSIS — I10 Essential (primary) hypertension: Secondary | ICD-10-CM

## 2010-12-01 NOTE — Progress Notes (Signed)
HPI:  This is an 77 year old white male patient who is here for routine follow-up. He has history of coronary artery disease status post remote CABG approximately 20 years ago. His last stress Myoview was 10/30/2010 that showed normal LV size and systolic function. There was a small area of infarction anteriorly. Findings at the base of the inferior wall likely represent diaphragmatic or soft tissue attenuation. A small degree of scarring in this region is not unequivocally excluded. Ejection fraction estimated at 52%.  The patient is doing well without any chest pain, palpitations, dyspnea, and dyspnea on exertion, dizziness, or presyncope. He has had skipping since his bypass but only notices it when he takes his blood pressure. His blood pressure is elevated today but he states it usually runs between 110 in the 120's systolic at home. He can mow his lawn and working in his yard without any symptoms.  The patient does admit to eating out at a [employer] on a regular basis.   No Known Allergies  Current Outpatient Prescriptions on File Prior to Visit  Medication Sig Dispense Refill  . albuterol (PROAIR HFA) 108 (90 BASE) MCG/ACT inhaler Inhale into the lungs as needed.        Marland Kitchen amLODipine (NORVASC) 10 MG tablet Take 1 tablet (10 mg total) by mouth daily.  30 tablet  4  . aspirin 81 MG EC tablet Take 81 mg by mouth daily.        . metoprolol succinate (TOPROL-XL) 25 MG 24 hr tablet Take 12.5 mg by mouth daily.        . Multiple Vitamin (MULTIVITAMIN) tablet Take 1 tablet by mouth daily.        . nitroGLYCERIN (NITROSTAT) 0.4 MG SL tablet Place 0.4 mg under the tongue every 5 (five) minutes as needed. May repeat every 5 minutes for up to 3 doses as needed.       . pseudoephedrine-guaifenesin (MUCINEX D) 60-600 MG per tablet Take 1 tablet by mouth daily.        . simvastatin (ZOCOR) 10 MG tablet Take 1 tablet (10 mg total) by mouth at bedtime.  30 tablet  6    Past Medical History    Diagnosis Date  . Cancer   . Hypertension   . Coronary artery disease     Multivessel, LVEF 60%  . Hyperlipidemia     Past Surgical History  Procedure Date  . Coronary artery bypass graft     LIMA to LAD, SVG to first and second diagonal, SVG to OM, SVG to PDA    No family history on file.  History   Social History  . Marital Status: Married    Spouse Name: N/A    Number of Children: N/A  . Years of Education: N/A   Occupational History  . [employer]     Retired   Social History Main Topics  . Smoking status: Former Smoker    Quit date: 04/18/1992  . Smokeless tobacco: Not on file  . Alcohol Use: No  . Drug Use: Not on file  . Sexually Active: Not on file   Other Topics Concern  . Not on file   Social History Narrative  . No narrative on file    ROS: See HPI Eyes: Negative Ears:Negative for hearing loss, tinnitus Cardiovascular: Negative for chest pain, palpitations,irregular heartbeat, dyspnea, dyspnea on exertion, near-syncope, orthopnea, paroxysmal nocturnal dyspnia and syncope,edema, claudication, cyanosis,.  Respiratory:   Negative for cough, hemoptysis, shortness of breath, sleep  disturbances due to breathing, sputum production and wheezing.   Endocrine: Negative for cold intolerance and heat intolerance.  Hematologic/Lymphatic: Negative for adenopathy and bleeding problem. Does not bruise/bleed easily.  Musculoskeletal: Negative.   Gastrointestinal: Negative for nausea, vomiting, reflux, abdominal pain, diarrhea, constipation.   Neurological: Negative.  Allergic/Immunologic: Negative for environmental allergies.   PHYSICAL EXAM: Well-nournished, in no acute distress. Neck: No JVD, HJR, Bruit, or thyroid enlargement Lungs: No tachypnea, clear without wheezing, rales, or rhonchi Cardiovascular: RRR with occasional skipping, positive S4, 2/6 systolic murmur at the left sternal border, no bruit, thrill, or heave. Abdomen: BS normal. Soft without  organomegaly, masses, lesions or tenderness. Extremities: without cyanosis, clubbing or edema. Good distal pulses bilateral SKin: Warm, no lesions or rashes  Musculoskeletal: No deformities Neuro: no focal signs  BP 160/85  Pulse 58  Ht 5\' 7"  (1.702 m)  Wt 157 lb (71.215 kg)  BMI 24.59 kg/m2  SpO2 94%

## 2010-12-01 NOTE — Patient Instructions (Signed)
Your physician recommends that you continue on your current medications as directed. Please refer to the Current Medication list given to you today.  Your physician recommends that you start a low sodium diet, please see handout given to you at today's visit  Your physician recommends that you schedule a follow-up appointment in: 6 months

## 2010-12-01 NOTE — Assessment & Plan Note (Signed)
Stable

## 2010-12-01 NOTE — Assessment & Plan Note (Signed)
Patient's blood pressure is elevated today. He takes his blood pressure once or twice today but forgot to bring his list in. He states his blood pressures are in the 120 systolic range at home. He will bring [place] his blood pressure readings tomorrow. I will not make any medicine changes until then. We also recommend 2 g sodium diet.

## 2010-12-01 NOTE — Assessment & Plan Note (Signed)
Patient has history of remote CABG. Stress Myoview October 30, 2010 showed no ischemia. Please see above for details. Continue medical management.

## 2010-12-22 ENCOUNTER — Other Ambulatory Visit: Payer: Self-pay | Admitting: *Deleted

## 2010-12-22 MED ORDER — METOPROLOL SUCCINATE ER 25 MG PO TB24: 12.5000 mg | ORAL_TABLET | Freq: Every day | ORAL | Status: DC

## 2011-01-21 NOTE — Progress Notes (Signed)
  NO additional notes at this time.

## 2011-06-08 ENCOUNTER — Other Ambulatory Visit: Payer: Self-pay | Admitting: *Deleted

## 2011-06-08 ENCOUNTER — Encounter: Payer: Self-pay | Admitting: Cardiology

## 2011-06-08 ENCOUNTER — Ambulatory Visit (INDEPENDENT_AMBULATORY_CARE_PROVIDER_SITE_OTHER): Payer: Medicare Other | Admitting: Cardiology

## 2011-06-08 VITALS — BP 180/85 | HR 59 | Resp 18 | Ht 67.0 in | Wt 159.0 lb

## 2011-06-08 DIAGNOSIS — I10 Essential (primary) hypertension: Secondary | ICD-10-CM

## 2011-06-08 DIAGNOSIS — E782 Mixed hyperlipidemia: Secondary | ICD-10-CM

## 2011-06-08 DIAGNOSIS — I2510 Atherosclerotic heart disease of native coronary artery without angina pectoris: Secondary | ICD-10-CM

## 2011-06-08 MED ORDER — LOSARTAN POTASSIUM 50 MG PO TABS: 50.0000 mg | ORAL_TABLET | Freq: Every day | ORAL | Status: DC

## 2011-06-08 MED ORDER — NITROGLYCERIN 0.4 MG/SPRAY TL SOLN: 1.0000 | Status: DC | PRN

## 2011-06-08 NOTE — Patient Instructions (Signed)
Your physician recommends that you schedule a follow-up appointment in: 6 months  Your physician has recommended you make the following change in your medication:  1 - STOP Labetalol 2 - START Losartan (cozaar) 50 mg daily  Your physician recommends that you return for lab work in: 2 weeks (you will receive a letter)

## 2011-06-08 NOTE — Assessment & Plan Note (Signed)
Also arrange FLP and LFT.

## 2011-06-08 NOTE — Assessment & Plan Note (Signed)
Blood pressure not well controlled. Continue Toprol XL and Norvasc. Add generic Cozaar 50 mg daily. Followup BMET 2 weeks.

## 2011-06-08 NOTE — Progress Notes (Signed)
   Clinical Summary Jay Lester is a 78 y.o.male presenting for followup. He was seen in August 2012. He reports no chest pain or progressive dyspnea. We reviewed his medications today - he had been taking both Labetalol and Toprol XL. Blood pressure not optimally controlled.  Labwork in July 2012 showed cholesterol 122, triglycerides 92, HDL 44, LDL 60, AST and ALT normal. Myoview from that time reviewed showing no active ischemia with LVEF 52%.  He denies palpitations or orthopnea. ECG noted below.   No Known Allergies  Current Outpatient Prescriptions  Medication Sig Dispense Refill  . albuterol (PROAIR HFA) 108 (90 BASE) MCG/ACT inhaler Inhale into the lungs as needed.        Marland Kitchen amLODipine (NORVASC) 10 MG tablet Take 1 tablet (10 mg total) by mouth daily.  30 tablet  4  . aspirin 81 MG EC tablet Take 81 mg by mouth daily.        . metoprolol succinate (TOPROL-XL) 25 MG 24 hr tablet Take 0.5 tablets (12.5 mg total) by mouth daily.  15 tablet  6  . Multiple Vitamin (MULTIVITAMIN) tablet Take 1 tablet by mouth daily.        . nitroGLYCERIN (NITROSTAT) 0.4 MG SL tablet Place 0.4 mg under the tongue every 5 (five) minutes as needed. May repeat every 5 minutes for up to 3 doses as needed.       . pseudoephedrine-guaifenesin (MUCINEX D) 60-600 MG per tablet Take 1 tablet by mouth daily.        . simvastatin (ZOCOR) 10 MG tablet Take 1 tablet (10 mg total) by mouth at bedtime.  30 tablet  6  . losartan (COZAAR) 50 MG tablet Take 1 tablet (50 mg total) by mouth daily.  30 tablet  12    Past Medical History  Diagnosis Date  . Cancer   . Hypertension   . Coronary artery disease     Multivessel, LVEF 60%  . Hyperlipidemia     Past Surgical History  Procedure Date  . Coronary artery bypass graft     LIMA to LAD, SVG to first and second diagonal, SVG to OM, SVG to PDA    No family history on file.  Social History Jay Lester reports that he quit smoking about 19 years ago. He does not  have any smokeless tobacco history on file. Jay Lester reports that he does not drink alcohol.  Review of Systems Hard of hearing, appetite stable, no reported bleeding problems. Otherwise negative except as outlined.  Physical Examination Filed Vitals:   06/08/11 1034  BP: 180/85  Pulse: 59  Resp: 18   Normally nourished appearing male in NAD. HEENT: Conjunctiva and lids normal, oropharynx clear. Neck: Supple, no elevated JVP or carotid bruits, no thyromegaly. Lungs: Clear to auscultation, nonlabored breathing at rest. Cardiac: Regular rate and rhythm, no S3 or significant systolic murmur, no pericardial rub. Abdomen: Soft, nontender, bowel sounds present, no guarding or rebound. Extremities: No pitting edema, distal pulses 1+. Skin: Warm and dry. Musculoskeletal: No kyphosis. Neuropsychiatric: Alert and oriented x3, affect grossly appropriate.   ECG Sinus bradycardia with RBBB.    Problem List and Plan

## 2011-06-08 NOTE — Assessment & Plan Note (Signed)
Symptomatically stable. We discussed his medications and reminded him to stop Labetalol. Continue observation. Followup arranged.

## 2011-06-09 ENCOUNTER — Other Ambulatory Visit: Payer: Self-pay | Admitting: *Deleted

## 2011-06-09 ENCOUNTER — Encounter: Payer: Self-pay | Admitting: *Deleted

## 2011-06-09 DIAGNOSIS — E782 Mixed hyperlipidemia: Secondary | ICD-10-CM

## 2011-06-09 DIAGNOSIS — I10 Essential (primary) hypertension: Secondary | ICD-10-CM

## 2011-06-13 ENCOUNTER — Other Ambulatory Visit: Payer: Self-pay

## 2011-06-13 MED ORDER — NITROGLYCERIN 0.4 MG/SPRAY TL SOLN: 1.0000 | Status: DC | PRN

## 2011-06-14 ENCOUNTER — Other Ambulatory Visit: Payer: Self-pay | Admitting: *Deleted

## 2011-06-14 MED ORDER — NITROGLYCERIN 0.4 MG SL SUBL: 0.4000 mg | SUBLINGUAL_TABLET | SUBLINGUAL | Status: DC | PRN

## 2011-06-16 ENCOUNTER — Telehealth: Payer: Self-pay

## 2011-06-16 ENCOUNTER — Other Ambulatory Visit: Payer: Self-pay

## 2011-06-16 DIAGNOSIS — E782 Mixed hyperlipidemia: Secondary | ICD-10-CM

## 2011-06-16 DIAGNOSIS — I10 Essential (primary) hypertension: Secondary | ICD-10-CM

## 2011-06-22 ENCOUNTER — Other Ambulatory Visit: Payer: Self-pay | Admitting: *Deleted

## 2011-06-22 MED ORDER — AMLODIPINE BESYLATE 10 MG PO TABS: 10.0000 mg | ORAL_TABLET | Freq: Every day | ORAL | Status: DC

## 2011-06-22 MED ORDER — NITROGLYCERIN 0.4 MG SL SUBL: 0.4000 mg | SUBLINGUAL_TABLET | SUBLINGUAL | Status: AC | PRN

## 2011-06-23 LAB — HEPATIC FUNCTION PANEL
AST: 16 U/L (ref 0–37)
Albumin: 4.5 g/dL (ref 3.5–5.2)
Alkaline Phosphatase: 49 U/L (ref 39–117)
Bilirubin, Direct: 0.2 mg/dL (ref 0.0–0.3)
Indirect Bilirubin: 0.4 mg/dL (ref 0.0–0.9)
Total Bilirubin: 0.6 mg/dL (ref 0.3–1.2)

## 2011-06-23 LAB — LIPID PANEL
HDL: 47 mg/dL (ref >39)
VLDL: 13 mg/dL (ref 0–40)

## 2011-06-23 LAB — BASIC METABOLIC PANEL
CO2: 33 mEq/L — ABNORMAL HIGH (ref 19–32)
Calcium: 9.9 mg/dL (ref 8.4–10.5)
Chloride: 103 mEq/L (ref 96–112)
Creat: 1.07 mg/dL (ref 0.50–1.35)
Glucose, Bld: 110 mg/dL — ABNORMAL HIGH (ref 70–99)

## 2011-07-18 ENCOUNTER — Other Ambulatory Visit: Payer: Self-pay | Admitting: *Deleted

## 2011-07-18 MED ORDER — METOPROLOL SUCCINATE ER 25 MG PO TB24: 12.5000 mg | ORAL_TABLET | Freq: Every day | ORAL | Status: DC

## 2011-07-18 MED ORDER — SIMVASTATIN 10 MG PO TABS: 10.0000 mg | ORAL_TABLET | Freq: Every day | ORAL | Status: DC

## 2011-09-26 ENCOUNTER — Encounter: Payer: Self-pay | Admitting: Internal Medicine

## 2011-10-12 ENCOUNTER — Ambulatory Visit (INDEPENDENT_AMBULATORY_CARE_PROVIDER_SITE_OTHER): Payer: Medicare Other | Admitting: Urgent Care

## 2011-10-12 VITALS — BP 172/90 | HR 60 | Temp 97.2°F | Ht 68.0 in | Wt 146.0 lb

## 2011-10-12 DIAGNOSIS — Z86010 Personal history of colonic polyps: Secondary | ICD-10-CM

## 2011-10-12 DIAGNOSIS — K5900 Constipation, unspecified: Secondary | ICD-10-CM

## 2011-10-12 DIAGNOSIS — Z800 Family history of malignant neoplasm of digestive organs: Secondary | ICD-10-CM | POA: Insufficient documentation

## 2011-10-12 DIAGNOSIS — Z860101 Personal history of adenomatous and serrated colon polyps: Secondary | ICD-10-CM | POA: Insufficient documentation

## 2011-10-12 MED ORDER — POLYETHYLENE GLYCOL 3350 17 GM/SCOOP PO POWD: 17.0000 g | Freq: Every day | ORAL | Status: AC

## 2011-10-12 NOTE — Progress Notes (Signed)
Primary Care Physician:  Alice Reichert, MD Primary Gastroenterologist:  Dr. Jena Gauss  Chief Complaint  Patient presents with  . Colonoscopy    determine need for colonoscopy    HPI:  Jay Lester is a 78 y.o. male here to discuss surveillance colonoscopy.  He has hx of incomplete difficult colonoscopy as per HPI in 2010 followed by Ba enema & virtual colonoscopy.  He also had 2 tubular adenomas removed at that time.  He tells me he has had constipation with a BM q 3-4 days & some hard stools.  He has tried citrucel with little help.  Otherwise he feels fine.   He tells me that he feels like he does not want to have any more colonoscopies given his age.  Denies diarrhea, rectal bleeding, melena or weight loss.   Denies any upper GI symptoms including heartburn, indigestion, nausea, vomiting, dysphagia, odynophagia or anorexia.  He does have family history of colon cancer in 2 brothers.  Past Medical History  Diagnosis Date  . Skin cancer   . Hypertension   . Coronary artery disease     Multivessel, LVEF 60%  . Hyperlipidemia   . Diverticulosis 2010  . Tubular adenoma 2010  . Redundant colon 2010    Past Surgical History  Procedure Date  . Coronary artery bypass graft 1994    LIMA to LAD, SVG to first and second diagonal, SVG to OM, SVG to PDA  . Colonoscopy 10/29/2008    Dr. Jena Gauss- incomplete, redundant elongated colon precluded complete exam, suboptimal prep, diffuse diverticulosis, 2 tubular adenomas  . Colonoscopy 12/09/08    Virtual Colonoscopy-normal    Current Outpatient Prescriptions  Medication Sig Dispense Refill  . albuterol (PROAIR HFA) 108 (90 BASE) MCG/ACT inhaler Inhale into the lungs as needed.        Marland Kitchen amLODipine (NORVASC) 10 MG tablet Take 1 tablet (10 mg total) by mouth daily.  30 tablet  12  . aspirin 81 MG EC tablet Take 81 mg by mouth daily.        Marland Kitchen losartan (COZAAR) 50 MG tablet Take 1 tablet (50 mg total) by mouth daily.  30 tablet  12  . metoprolol  succinate (TOPROL-XL) 25 MG 24 hr tablet Take 0.5 tablets (12.5 mg total) by mouth daily.  15 tablet  6  . Multiple Vitamin (MULTIVITAMIN) tablet Take 1 tablet by mouth daily.        . nitroGLYCERIN (NITROSTAT) 0.4 MG SL tablet Place 1 tablet (0.4 mg total) under the tongue every 5 (five) minutes as needed.  100 tablet  3  . pseudoephedrine-guaifenesin (MUCINEX D) 60-600 MG per tablet Take 1 tablet by mouth daily. As needed only      . simvastatin (ZOCOR) 10 MG tablet Take 1 tablet (10 mg total) by mouth at bedtime.  30 tablet  6  . polyethylene glycol powder (MIRALAX) powder Take 17 g by mouth daily.  527 g  11    Allergies as of 10/12/2011  . (No Known Allergies)    Family History  Problem Relation Age of Onset  . Colon cancer  73  . Colon cancer  29    History   Social History  . Marital Status: Married    Spouse Name: N/A    Number of Children: 6  . Years of Education: N/A   Occupational History  . [employer]     Retired   Social History Main Topics  . Smoking status: Former Smoker  Quit date: 04/18/1992  . Smokeless tobacco: Not on file   Comment: Quit 19 yrs ago  . Alcohol Use: No  . Drug Use: No  . Sexually Active: Not on file   Other Topics Concern  . Not on file   Social History Narrative  . No narrative on file  Review of Systems: Gen: Denies any fever, chills, sweats, anorexia, fatigue, weakness, malaise, weight loss, and sleep disorder CV: Denies chest pain, angina, palpitations, syncope, orthopnea, PND, peripheral edema, and claudication. Resp: +cough, small amts white phlegm at times.  Denies dyspnea at rest, dyspnea with exercise, wheezing, coughing up blood, and pleurisy. GI: Denies vomiting blood, jaundice, and fecal incontinence. GU : Denies urinary burning, blood in urine, urinary frequency, urinary hesitancy, nocturnal urination, and urinary incontinence. MS: Denies joint pain, limitation of movement, and swelling, stiffness, low back pain,  extremity pain. Denies muscle weakness, cramps, atrophy.  Derm: Denies rash, itching, dry skin, hives, moles, warts, or unhealing ulcers.  Psych: Denies depression, anxiety, memory loss, suicidal ideation, hallucinations, paranoia, and confusion. Heme: Denies bruising, bleeding, and enlarged lymph nodes. Neuro:  Denies any headaches, dizziness, paresthesias. Endo:  Denies any problems with DM, thyroid, adrenal function.  Physical Exam: BP 172/90  Pulse 60  Temp 97.2 F (36.2 C) (Tympanic)  Ht 5\' 8"  (1.727 m)  Wt 146 lb (66.225 kg)  BMI 22.20 kg/m2 General:   Alert,  Well-developed, well-nourished, pleasant and cooperative in NAD Head:  Normocephalic and atraumatic. Eyes:  Sclera clear, no icterus.   Conjunctiva pink. Ears:  Normal auditory acuity. Nose:  No deformity, discharge, or lesions. Mouth:  No deformity or lesions,oropharynx pink & moist. Neck:  Supple; no masses or thyromegaly. Lungs:  Clear throughout to auscultation.   No wheezes, crackles, or rhonchi. No acute distress. Heart:  Regular rate and rhythm; no murmurs, clicks, rubs,  or gallops. Abdomen:  Normal bowel sounds.  No bruits.  Soft, non-tender and non-distended without masses, hepatosplenomegaly or hernias noted.  No guarding or rebound tenderness.   Rectal:  Deferred. Msk:  Symmetrical without gross deformities. Normal posture. Pulses:  Normal pulses noted. Extremities:  No clubbing or edema. Neurologic:  Alert and  oriented x4;  grossly normal neurologically. Skin:  Intact without significant lesions or rashes. Lymph Nodes:  No significant cervical adenopathy. Psych:  Alert and cooperative. Normal mood and affect.

## 2011-10-12 NOTE — Assessment & Plan Note (Signed)
Continue citrucel Begin Miralax 17 grams prn daily for constipation Constipation literature

## 2011-10-12 NOTE — Patient Instructions (Addendum)
Call us if you change your mind about colonoscopy or have any new symptoms Begin miralax 17 grams daily as needed for constipation  Constipation in Adults Constipation is having fewer than 2 bowel movements per week. Usually, the stools are hard. As we grow older, constipation is more common. If you try to fix constipation with laxatives, the problem may get worse. This is because laxatives taken over a long period of time make the colon muscles weaker. A low-fiber diet, not taking in enough fluids, and taking some medicines may make these problems worse. MEDICATIONS THAT MAY CAUSE CONSTIPATION  Water pills (diuretics).   Calcium channel blockers (used to control blood pressure and for the heart).   Certain pain medicines (narcotics).   Anticholinergics.   Anti-inflammatory agents.   Antacids that contain aluminum.  DISEASES THAT CONTRIBUTE TO CONSTIPATION  Diabetes.   Parkinson's disease.   Dementia.   Stroke.   Depression.   Illnesses that cause problems with salt and water metabolism.  HOME CARE INSTRUCTIONS   Constipation is usually best cared for without medicines. Increasing dietary fiber and eating more fruits and vegetables is the best way to manage constipation.   Slowly increase fiber intake to 25 to 38 grams per day. Whole grains, fruits, vegetables, and legumes are good sources of fiber. A dietitian can further help you incorporate high-fiber foods into your diet.   Drink enough water and fluids to keep your urine clear or pale yellow.   A fiber supplement may be added to your diet if you cannot get enough fiber from foods.   Increasing your activities also helps improve regularity.   Suppositories, as suggested by your caregiver, will also help. If you are using antacids, such as aluminum or calcium containing products, it will be helpful to switch to products containing magnesium if your caregiver says it is okay.   If you have been given a liquid injection  (enema) today, this is only a temporary measure. It should not be relied on for treatment of longstanding (chronic) constipation.   Stronger measures, such as magnesium sulfate, should be avoided if possible. This may cause uncontrollable diarrhea. Using magnesium sulfate may not allow you time to make it to the bathroom.  SEEK IMMEDIATE MEDICAL CARE IF:   There is bright red blood in the stool.   The constipation stays for more than 4 days.   There is belly (abdominal) or rectal pain.   You do not seem to be getting better.   You have any questions or concerns.  MAKE SURE YOU:   Understand these instructions.   Will watch your condition.   Will get help right away if you are not doing well or get worse.  Document Released: 01/01/2004 Document Revised: 03/24/2011 Document Reviewed: 03/08/2011 [facility] Patient Information 2012 [place], [place].

## 2011-10-12 NOTE — Assessment & Plan Note (Signed)
Jay Lester is a pleasant 78 y.o. male with hx of tubular adenomas & family hx of colon cancer.  Given his age, he does not desire to pursue any further surveillance colonoscopies.  He understands risks/benefits & I feel this is a reasonable decision.  Instructed to call us if he changes his mind about colonoscopy or has any new symptoms

## 2011-10-13 NOTE — Progress Notes (Signed)
Faxed to PCP

## 2011-12-05 ENCOUNTER — Ambulatory Visit (INDEPENDENT_AMBULATORY_CARE_PROVIDER_SITE_OTHER): Payer: Medicare Other | Admitting: Adult Health

## 2011-12-05 ENCOUNTER — Encounter: Payer: Self-pay | Admitting: Adult Health

## 2011-12-05 VITALS — BP 150/80 | HR 57 | Ht 68.0 in | Wt 146.0 lb

## 2011-12-05 DIAGNOSIS — E785 Hyperlipidemia, unspecified: Secondary | ICD-10-CM

## 2011-12-05 DIAGNOSIS — I2510 Atherosclerotic heart disease of native coronary artery without angina pectoris: Secondary | ICD-10-CM

## 2011-12-05 DIAGNOSIS — I10 Essential (primary) hypertension: Secondary | ICD-10-CM

## 2011-12-05 NOTE — Patient Instructions (Addendum)
Your physician wants you to follow-up in: 12 months with Joni Reining, NP.  You will receive a reminder letter in the mail two months in advance. If you don't receive a letter, please call our office to schedule the follow-up appointment.  FASTING LABS:  CBC, CMET, LIPID PANEL.

## 2011-12-05 NOTE — Assessment & Plan Note (Signed)
Blood pressure is mildly elevated on this visit, he was laid in a hurry, and is not normally at the 130 systolic on the last few visits and at home. Continue current medication regimen as prescribed.

## 2011-12-05 NOTE — Progress Notes (Signed)
   HPI: Jay Lester is an 78 year old patient of Dr. Simona Huh that we are following for ongoing assessment treatment of hypertension, and CAD. The patient is seen on an annual basis. He has been without complaint of recurrent chest discomfort, elevated heart rate, or hypertension. Patient has complaints of some generalized sinus congestion, he sometimes uses Mucinex for this. He has had no ER visits, or hospitalization since being seen last.  No Known Allergies  Current Outpatient Prescriptions  Medication Sig Dispense Refill  . albuterol (PROAIR HFA) 108 (90 BASE) MCG/ACT inhaler Inhale into the lungs as needed.        Marland Kitchen amLODipine (NORVASC) 10 MG tablet Take 1 tablet (10 mg total) by mouth daily.  30 tablet  12  . aspirin 81 MG EC tablet Take 81 mg by mouth daily.        Marland Kitchen losartan (COZAAR) 50 MG tablet Take 1 tablet (50 mg total) by mouth daily.  30 tablet  12  . metoprolol succinate (TOPROL-XL) 25 MG 24 hr tablet Take 0.5 tablets (12.5 mg total) by mouth daily.  15 tablet  6  . Multiple Vitamin (MULTIVITAMIN) tablet Take 1 tablet by mouth daily.        . nitroGLYCERIN (NITROSTAT) 0.4 MG SL tablet Place 1 tablet (0.4 mg total) under the tongue every 5 (five) minutes as needed.  100 tablet  3  . pseudoephedrine-guaifenesin (MUCINEX D) 60-600 MG per tablet Take 1 tablet by mouth daily. As needed only      . simvastatin (ZOCOR) 10 MG tablet Take 1 tablet (10 mg total) by mouth at bedtime.  30 tablet  6    Past Medical History  Diagnosis Date  . Skin cancer   . Hypertension   . Coronary artery disease     Multivessel, LVEF 60%  . Hyperlipidemia   . Diverticulosis 2010  . Tubular adenoma 2010  . Redundant colon 2010    Past Surgical History  Procedure Date  . Coronary artery bypass graft 1994    LIMA to LAD, SVG to first and second diagonal, SVG to OM, SVG to PDA  . Colonoscopy 10/29/2008    Dr. Jena Gauss- incomplete, redundant elongated colon precluded complete exam, suboptimal prep,  diffuse diverticulosis, 2 tubular adenomas  . Colonoscopy 12/09/08    Virtual Colonoscopy-normal    ZHY:QMVHQI of systems complete and found to be negative unless listed above PHYSICAL EXAM BP 150/80  Pulse 57  Ht 5\' 8"  (1.727 m)  Wt 146 lb (66.225 kg)  BMI 22.20 kg/m2  General: Well developed, well nourished, in no acute distress Head: Eyes PERRLA, No xanthomas.   Normal cephalic and atramatic  Lungs: Clear bilaterally to auscultation and percussion. Heart: HRRR S1 S2,1/6 systolic murmur.   Pulses are 2+ & equal.            No carotid bruit. No JVD.  No abdominal bruits. No femoral bruits. Abdomen: Bowel sounds are positive, abdomen soft and non-tender without masses or                  Hernia's noted. Msk:  Back normal, normal gait. Normal strength and tone for age. Extremities: No clubbing, cyanosis or edema.  DP +1 Neuro: Alert and oriented X 3. Psych:  Good affect, responds appropriately    ASSESSMENT AND PLAN

## 2011-12-05 NOTE — Assessment & Plan Note (Signed)
Ms. Jay Lester is doing well from a cardiac standpoint. He denies any symptoms. He is medically compliant, and has had no hospitalizations for cardiac issues. We will continue him on his current medication regimen. We will continue risk stratification and evaluation. He will need to have lab work completed to include lipids LFTs, BMET, and a CBC, the patient's labs will be sent to his primary care physician Dr. Megan Mans. Will see him again in one year unless he becomes symptomatic.

## 2011-12-06 ENCOUNTER — Other Ambulatory Visit: Payer: Self-pay | Admitting: Adult Health

## 2011-12-06 ENCOUNTER — Ambulatory Visit: Payer: Medicare Other | Admitting: Adult Health

## 2011-12-06 LAB — COMPREHENSIVE METABOLIC PANEL
ALT: 12 U/L (ref 0–53)
Albumin: 4.2 g/dL (ref 3.5–5.2)
CO2: 28 mEq/L (ref 19–32)
Calcium: 9.5 mg/dL (ref 8.4–10.5)
Chloride: 107 mEq/L (ref 96–112)
Glucose, Bld: 123 mg/dL — ABNORMAL HIGH (ref 70–99)
Sodium: 145 mEq/L (ref 135–145)
Total Bilirubin: 0.8 mg/dL (ref 0.3–1.2)
Total Protein: 6.3 g/dL (ref 6.0–8.3)

## 2011-12-06 LAB — CBC WITH DIFFERENTIAL/PLATELET
Eosinophils Absolute: 0.2 10*3/uL (ref 0.0–0.7)
Eosinophils Relative: 3 % (ref 0–5)
Hemoglobin: 12.9 g/dL — ABNORMAL LOW (ref 13.0–17.0)
Lymphocytes Relative: 30 % (ref 12–46)
Lymphs Abs: 2.6 10*3/uL (ref 0.7–4.0)
MCH: 30.9 pg (ref 26.0–34.0)
MCV: 90.4 fL (ref 78.0–100.0)
Monocytes Relative: 10 % (ref 3–12)
Neutrophils Relative %: 57 % (ref 43–77)
RBC: 4.17 MIL/uL — ABNORMAL LOW (ref 4.22–5.81)
WBC: 8.9 10*3/uL (ref 4.0–10.5)

## 2011-12-06 LAB — LIPID PANEL
Cholesterol: 100 mg/dL (ref 0–200)
Triglycerides: 71 mg/dL (ref <150)
VLDL: 14 mg/dL (ref 0–40)

## 2012-02-23 ENCOUNTER — Other Ambulatory Visit: Payer: Self-pay | Admitting: Cardiology

## 2012-02-23 ENCOUNTER — Emergency Department (HOSPITAL_COMMUNITY)
Admission: EM | Admit: 2012-02-23 | Discharge: 2012-02-23 | Disposition: A | Discharge status: Home / Self Care | Payer: Medicare Other | Attending: Emergency Medicine | Admitting: Emergency Medicine

## 2012-02-23 ENCOUNTER — Encounter (HOSPITAL_COMMUNITY): Payer: Self-pay | Admitting: Emergency Medicine

## 2012-02-23 DIAGNOSIS — Z8589 Personal history of malignant neoplasm of other organs and systems: Secondary | ICD-10-CM | POA: Insufficient documentation

## 2012-02-23 DIAGNOSIS — Z8719 Personal history of other diseases of the digestive system: Secondary | ICD-10-CM | POA: Insufficient documentation

## 2012-02-23 DIAGNOSIS — R040 Epistaxis: Secondary | ICD-10-CM | POA: Insufficient documentation

## 2012-02-23 DIAGNOSIS — E785 Hyperlipidemia, unspecified: Secondary | ICD-10-CM | POA: Insufficient documentation

## 2012-02-23 DIAGNOSIS — Z79899 Other long term (current) drug therapy: Secondary | ICD-10-CM | POA: Insufficient documentation

## 2012-02-23 DIAGNOSIS — I2510 Atherosclerotic heart disease of native coronary artery without angina pectoris: Secondary | ICD-10-CM | POA: Insufficient documentation

## 2012-02-23 DIAGNOSIS — I10 Essential (primary) hypertension: Secondary | ICD-10-CM | POA: Insufficient documentation

## 2012-02-23 LAB — COMPREHENSIVE METABOLIC PANEL
ALT: 14 U/L (ref 0–53)
AST: 19 U/L (ref 0–37)
BUN: 13 mg/dL (ref 6–23)
Calcium: 10 mg/dL (ref 8.4–10.5)
Creatinine, Ser: 1.09 mg/dL (ref 0.50–1.35)
GFR calc non Af Amer: 59 mL/min — ABNORMAL LOW (ref >90)
Glucose, Bld: 114 mg/dL — ABNORMAL HIGH (ref 70–99)
Potassium: 4.4 mEq/L (ref 3.5–5.1)
Total Bilirubin: 0.7 mg/dL (ref 0.3–1.2)

## 2012-02-23 LAB — CBC WITH DIFFERENTIAL/PLATELET
Basophils Relative: 0 % (ref 0–1)
Eosinophils Absolute: 0.1 10*3/uL (ref 0.0–0.7)
Eosinophils Relative: 1 % (ref 0–5)
HCT: 42.6 % (ref 39.0–52.0)
Hemoglobin: 14.4 g/dL (ref 13.0–17.0)
Lymphs Abs: 2.3 10*3/uL (ref 0.7–4.0)
MCH: 31.2 pg (ref 26.0–34.0)
MCHC: 33.8 g/dL (ref 30.0–36.0)
MCV: 92.4 fL (ref 78.0–100.0)
Monocytes Absolute: 0.7 10*3/uL (ref 0.1–1.0)
Monocytes Relative: 9 % (ref 3–12)
RBC: 4.61 MIL/uL (ref 4.22–5.81)

## 2012-02-23 MED ORDER — OXYMETAZOLINE HCL 0.05 % NA SOLN
1.0000 | Freq: Two times a day (BID) | NASAL | Status: DC
  Administered 2012-02-23: 1 via NASAL
  Filled 2012-02-23: qty 15

## 2012-02-23 MED ORDER — SILVER NITRATE-POT NITRATE 75-25 % EX MISC
CUTANEOUS | Status: AC
  Filled 2012-02-23: qty 1

## 2012-02-23 MED ORDER — SIMVASTATIN 10 MG PO TABS: 10.0000 mg | ORAL_TABLET | Freq: Every day | ORAL | Status: DC

## 2012-02-23 NOTE — ED Notes (Signed)
Pt c/o nosebleeds every morning x 3 days.

## 2012-02-23 NOTE — ED Provider Notes (Signed)
History   This chart was scribed for Glynn Octave, MD by Charolett Bumpers . The patient was seen in room APA15/APA15. Patient's care was started at 0757.   CSN: [id number]  Arrival date & time 02/23/12  9147   First MD Initiated Contact with Patient 02/23/12 0757      Chief Complaint  Patient presents with  . Epistaxis    The history is provided by the patient. No language interpreter was used.   Jay Lester is a 78 y.o. male who presents to the Emergency Department complaining of episodes of epistaxis from the right side in the morning for the past 3 days. He states the nosebleeds stop. Spontaneously. He denies any h/o similar symptoms. He reports he takes aspirin daily but denies taking any other anticoagulants. He denies any chest pain, SOB, dizziness, lightheadedness, vomiting. He denies taking any medications for his symptoms. He denies any known injuries. He states he doesn't wear O2 at home. He has a h/o HTN and bypass.   PCP: Dr. Renard Matter  Past Medical History  Diagnosis Date  . Skin cancer   . Hypertension   . Coronary artery disease     Multivessel, LVEF 60%  . Hyperlipidemia   . Diverticulosis 2010  . Tubular adenoma 2010  . Redundant colon 2010    Past Surgical History  Procedure Date  . Coronary artery bypass graft 1994    LIMA to LAD, SVG to first and second diagonal, SVG to OM, SVG to PDA  . Colonoscopy 10/29/2008    Dr. Jena Gauss- incomplete, redundant elongated colon precluded complete exam, suboptimal prep, diffuse diverticulosis, 2 tubular adenomas  . Colonoscopy 12/09/08    Virtual Colonoscopy-normal    Family History  Problem Relation Age of Onset  . Colon cancer  75  . Colon cancer  84    History  Substance Use Topics  . Smoking status: Former Smoker    Quit date: 04/18/1992  . Smokeless tobacco: Not on file     Comment: Quit 19 yrs ago  . Alcohol Use: No      Review of Systems A complete 10 system review of systems was obtained  and all systems are negative except as noted in the HPI and PMH.   Allergies  Review of patient's allergies indicates no known allergies.  Home Medications   Current Outpatient Rx  Name  Route  Sig  Dispense  Refill  . ALBUTEROL SULFATE HFA 108 (90 BASE) MCG/ACT IN AERS   Inhalation   Inhale into the lungs as needed.           Marland Kitchen AMLODIPINE BESYLATE 10 MG PO TABS   Oral   Take 1 tablet (10 mg total) by mouth daily.   30 tablet   12   . ASPIRIN 81 MG PO TBEC   Oral   Take 81 mg by mouth daily.           Marland Kitchen LOSARTAN POTASSIUM 50 MG PO TABS   Oral   Take 1 tablet (50 mg total) by mouth daily.   30 tablet   12   . METOPROLOL SUCCINATE ER 25 MG PO TB24   Oral   Take 0.5 tablets (12.5 mg total) by mouth daily.   15 tablet   6   . ONE-DAILY MULTI VITAMINS PO TABS   Oral   Take 1 tablet by mouth daily.           Marland Kitchen NITROGLYCERIN 0.4 MG SL SUBL  Sublingual   Place 1 tablet (0.4 mg total) under the tongue every 5 (five) minutes as needed.   100 tablet   3   . SIMVASTATIN 10 MG PO TABS   Oral   Take 1 tablet (10 mg total) by mouth at bedtime.   30 tablet   6     Please notify pt. when RX is ready for pick up. Th ...     BP 176/88  Pulse 75  Temp 97.4 F (36.3 C) (Oral)  Resp 18  Ht 5\' 8"  (1.727 m)  Wt 145 lb (65.772 kg)  BMI 22.05 kg/m2  SpO2 98%  Physical Exam  Nursing note and vitals reviewed. Constitutional: He is oriented to person, place, and time. He appears well-developed and well-nourished. No distress.  HENT:  Head: Normocephalic and atraumatic.  Right Ear: External ear normal.  Left Ear: External ear normal.  Mouth/Throat: Oropharynx is clear and moist. No oropharyngeal exudate.       No blood in oropharynx. Left nare clear, no evidence of bleeding. Right nare shows dried blood with no active bleeding, spots of blood on right nasal alae.  Eyes: Conjunctivae normal and EOM are normal. Pupils are equal, round, and reactive to light.  Neck:  Normal range of motion. Neck supple. No tracheal deviation present.  Cardiovascular: Normal rate, regular rhythm and normal heart sounds.   No murmur heard. Pulmonary/Chest: Effort normal and breath sounds normal. No respiratory distress. He has no wheezes. He has no rales.  Abdominal: Soft. Bowel sounds are normal. He exhibits no distension. There is no tenderness.  Musculoskeletal: Normal range of motion. He exhibits no edema.  Neurological: He is alert and oriented to person, place, and time.  Skin: Skin is warm and dry.  Psychiatric: He has a normal mood and affect. His behavior is normal.    ED Course  EPISTAXIS MANAGEMENT Performed by: Glynn Octave Authorized by: Glynn Octave Consent: Verbal consent obtained. Risks and benefits: risks, benefits and alternatives were discussed Consent given by: patient Patient identity confirmed: verbally with patient Local anesthetic: topical anesthetic Treatment site: right anterior Repair method: cophenylcaine and suction Post-procedure assessment: bleeding stopped Treatment complexity: simple Recurrence: recurrence of recent bleed Patient tolerance: Patient tolerated the procedure well with no immediate complications. Comments: Wound seal powder used   (including critical care time)  DIAGNOSTIC STUDIES: Oxygen Saturation is 98% on room air, normal by my interpretation.    COORDINATION OF CARE:  08:15-Discussed planned course of treatment with the patient including blood work and Afrin, who is agreeable at this time.   08:49-Recheck: Applied wound seal to the pt's right nare after pt started having active bleeding in ED.  09:20-Recheck: Bleeding appears to have stopped after the wound seal powder was applied.   09:50-Recheck: No active bleeding visualized at this time. Will d/c pt home. Pt is agreeable.   10:00-Medication Orders: Oxymetazoline (Afrin) 0.05% nasal spray 1 spray-2 times daily.    Labs Reviewed    COMPREHENSIVE METABOLIC PANEL - Abnormal; Notable for the following:    Glucose, Bld 114 (*)     GFR calc non Af Amer 59 (*)     GFR calc Af Amer 69 (*)     All other components within normal limits  CBC WITH DIFFERENTIAL  PROTIME-INR   No results found.   No diagnosis found.    MDM  Intermittent right-sided epistaxis for the past 3 mornings it resolved spontaneously after one hour. No difficulty breathing or swallowing. No  bleeding currently. No anticoagulation except for aspirin. Denies trauma to nose  Hemoglobin stable. No distress. Vitals stable.  Oozing of R nasal septum stopped with wound seal powder.  Patient observed in ED for one hour with no recurrence of bleeding. Return precautions discussed, ENT followup given.  I personally performed the services described in this documentation, which was scribed in my presence.  The recorded information has been reviewed and considered.     Glynn Octave, MD 02/23/12 [phone]

## 2012-02-23 NOTE — ED Notes (Signed)
Patient with no complaints at this time. Respirations even and unlabored. Skin warm/dry. Discharge instructions reviewed with patient at this time. Patient given opportunity to voice concerns/ask questions. Patient discharged at this time and left Emergency Department with steady gait.   

## 2012-03-20 ENCOUNTER — Other Ambulatory Visit: Payer: Self-pay | Admitting: Cardiology

## 2012-03-20 MED ORDER — METOPROLOL SUCCINATE ER 25 MG PO TB24: 12.5000 mg | ORAL_TABLET | Freq: Every day | ORAL | Status: DC

## 2012-05-24 ENCOUNTER — Other Ambulatory Visit (HOSPITAL_COMMUNITY): Payer: Self-pay | Admitting: Urology

## 2012-05-24 DIAGNOSIS — N400 Enlarged prostate without lower urinary tract symptoms: Secondary | ICD-10-CM

## 2012-05-29 ENCOUNTER — Ambulatory Visit (HOSPITAL_COMMUNITY)
Admission: RE | Admit: 2012-05-29 | Discharge: 2012-05-29 | Disposition: A | Discharge status: Home / Self Care | Payer: Medicare Other | Source: Ambulatory Visit | Attending: Urology | Admitting: Urology

## 2012-05-29 DIAGNOSIS — N400 Enlarged prostate without lower urinary tract symptoms: Secondary | ICD-10-CM | POA: Insufficient documentation

## 2012-06-18 ENCOUNTER — Other Ambulatory Visit: Payer: Self-pay | Admitting: Cardiology

## 2012-08-16 ENCOUNTER — Other Ambulatory Visit: Payer: Self-pay | Admitting: Adult Health

## 2012-10-16 ENCOUNTER — Encounter: Payer: Self-pay | Admitting: Cardiology

## 2012-10-16 ENCOUNTER — Ambulatory Visit (INDEPENDENT_AMBULATORY_CARE_PROVIDER_SITE_OTHER): Payer: Medicare Other | Admitting: Cardiology

## 2012-10-16 ENCOUNTER — Other Ambulatory Visit: Payer: Self-pay | Admitting: Cardiology

## 2012-10-16 VITALS — BP 168/76 | HR 54 | Ht 68.0 in | Wt 153.1 lb

## 2012-10-16 DIAGNOSIS — I2510 Atherosclerotic heart disease of native coronary artery without angina pectoris: Secondary | ICD-10-CM

## 2012-10-16 DIAGNOSIS — E782 Mixed hyperlipidemia: Secondary | ICD-10-CM

## 2012-10-16 DIAGNOSIS — I10 Essential (primary) hypertension: Secondary | ICD-10-CM

## 2012-10-16 MED ORDER — AMLODIPINE BESYLATE 5 MG PO TABS: 5.0000 mg | ORAL_TABLET | Freq: Every day | ORAL | Status: DC

## 2012-10-16 MED ORDER — LOSARTAN POTASSIUM 25 MG PO TABS: 25.0000 mg | ORAL_TABLET | Freq: Every day | ORAL | Status: DC

## 2012-10-16 MED ORDER — LOSARTAN POTASSIUM 100 MG PO TABS: 100.0000 mg | ORAL_TABLET | Freq: Every day | ORAL | Status: DC

## 2012-10-16 NOTE — Assessment & Plan Note (Addendum)
Increase Cozaar to 100 mg daily, followup BMET in 2 weeks. Will reduce Norvasc to 5 mg daily to see if this helps to reduce his edema.

## 2012-10-16 NOTE — Assessment & Plan Note (Signed)
Symptomatically stable on current regimen. ECG reviewed.

## 2012-10-16 NOTE — Telephone Encounter (Signed)
Medication sent via escribe for metoprolol succinate (TOPROL-XL) 25 MG 24 hr tablet.

## 2012-10-16 NOTE — Assessment & Plan Note (Signed)
Check FLP and LFTs with next blood draw.

## 2012-10-16 NOTE — Progress Notes (Signed)
Clinical Summary Jay Lester is an 79 y.o.male last seen by Ms. Lawrence NP in August 2013. He presents without complaints of any significant angina symptoms. States that his systolic blood pressure has been at least 140 when he checks it at home. His ECG today shows sinus bradycardia with prolonged PR interval and right bundle branch block. He reports compliance with his medications. He does report some leg edema.  Lab work from August 2013 revealed cholesterol 100, triglycerides of 71, HDL 38, LDL 48. Labwork from November 2013 showed potassium 4.4, BUN 13, creatinine 1.0, hemoglobin 14.4, platelets 173.  No Known Allergies  Current Outpatient Prescriptions  Medication Sig Dispense Refill  . albuterol (PROAIR HFA) 108 (90 BASE) MCG/ACT inhaler Inhale 2 puffs into the lungs every 6 (six) hours as needed. Shortness of breath      . aspirin 81 MG EC tablet Take 81 mg by mouth daily. Takes every once in a while      . metoprolol succinate (TOPROL-XL) 25 MG 24 hr tablet Take 0.5 tablets (12.5 mg total) by mouth daily.  15 tablet  6  . Multiple Vitamin (MULTIVITAMIN) tablet Take 1 tablet by mouth daily.        . nitroGLYCERIN (NITROSTAT) 0.4 MG SL tablet Place 1 tablet (0.4 mg total) under the tongue every 5 (five) minutes as needed.  100 tablet  3  . simvastatin (ZOCOR) 10 MG tablet TAKE (1) TABLET BY MOUTH AT BEDTIME.  30 tablet  6  . amLODipine (NORVASC) 5 MG tablet Take 1 tablet (5 mg total) by mouth daily.  180 tablet  3  . LORazepam (ATIVAN) 0.5 MG tablet       . losartan (COZAAR) 100 MG tablet Take 1 tablet (100 mg total) by mouth daily.  90 tablet  3  . polyethylene glycol powder (GLYCOLAX/MIRALAX) powder       . tamsulosin (FLOMAX) 0.4 MG CAPS        No current facility-administered medications for this visit.    Past Medical History  Diagnosis Date  . Skin cancer   . Hypertension   . Coronary artery disease     Multivessel, LVEF 60%  . Hyperlipidemia   . Diverticulosis 2010    . Tubular adenoma 2010  . Redundant colon 2010    Past Surgical History  Procedure Laterality Date  . Coronary artery bypass graft  1994    LIMA to LAD, SVG to first and second diagonal, SVG to OM, SVG to PDA  . Colonoscopy  10/29/2008    Dr. Jena Gauss- incomplete, redundant elongated colon precluded complete exam, suboptimal prep, diffuse diverticulosis, 2 tubular adenomas  . Colonoscopy  12/09/08    Virtual Colonoscopy-normal    Social History Jay Lester reports that he quit smoking about 20 years ago. He does not have any smokeless tobacco history on file. Jay Lester reports that he does not drink alcohol.  Review of Systems No palpitations. Stable appetite. Does report easy bleeding, has cut his aspirin back to every other day. Otherwise negative.  Physical Examination Filed Vitals:   10/16/12 1450  BP: 168/76  Pulse: 54   Filed Weights   10/16/12 1450  Weight: 153 lb 1.9 oz (69.455 kg)    Comfortable at rest. HEENT: Conjunctiva and lids normal, oropharynx clear.  Neck: Supple, no elevated JVP or carotid bruits, no thyromegaly.  Lungs: Clear to auscultation, nonlabored breathing at rest.  Cardiac: Regular rate and rhythm, no S3, soft systolic murmur, no pericardial rub.  Abdomen: Soft, nontender, bowel sounds present. Extremities: No pitting edema, distal pulses 1+.  Neuropsychiatric: Alert and oriented x3, affect grossly appropriate.   Problem List and Plan   CORONARY ATHEROSCLEROSIS NATIVE CORONARY ARTERY Symptomatically stable on current regimen. ECG reviewed.  Essential hypertension, benign Increase Cozaar to 100 mg daily, followup BMET in 2 weeks. Will reduce Norvasc to 5 mg daily to see if this helps to reduce his edema.  Mixed hyperlipidemia Check FLP and LFTs with next blood draw.    Jonelle Sidle, M.D., F.A.C.C.

## 2012-10-16 NOTE — Patient Instructions (Addendum)
Your physician recommends that you schedule a follow-up appointment in: 6 MONTHS WITH DR [facility]  Your physician has recommended you make the following change in your medication:   1) START COZAAR 100MG  ONCE DAILY 2) DECREASE YOUR NORVASC TO 5MG  ONCE DAILY  Your physician recommends that you return for lab work in: TWO WEEKS (CMET,FASTING LIPIDS)

## 2012-10-30 LAB — COMPREHENSIVE METABOLIC PANEL
ALT: 11 U/L (ref 0–53)
Albumin: 4 g/dL (ref 3.5–5.2)
CO2: 31 mEq/L (ref 19–32)
Chloride: 103 mEq/L (ref 96–112)
Glucose, Bld: 107 mg/dL — ABNORMAL HIGH (ref 70–99)
Potassium: 4.4 mEq/L (ref 3.5–5.3)
Sodium: 141 mEq/L (ref 135–145)
Total Protein: 6.3 g/dL (ref 6.0–8.3)

## 2012-10-30 LAB — LIPID PANEL
Cholesterol: 94 mg/dL (ref 0–200)
Triglycerides: 68 mg/dL (ref <150)

## 2012-11-01 ENCOUNTER — Encounter: Payer: Self-pay | Admitting: *Deleted

## 2012-12-18 ENCOUNTER — Other Ambulatory Visit: Payer: Self-pay | Admitting: Urgent Care

## 2013-04-17 ENCOUNTER — Ambulatory Visit: Payer: Medicare Other | Admitting: Cardiology

## 2013-04-30 ENCOUNTER — Ambulatory Visit (INDEPENDENT_AMBULATORY_CARE_PROVIDER_SITE_OTHER): Payer: Medicare Other | Admitting: Cardiology

## 2013-04-30 ENCOUNTER — Encounter: Payer: Self-pay | Admitting: Cardiology

## 2013-04-30 VITALS — BP 158/78 | HR 73 | Ht 68.0 in | Wt 155.0 lb

## 2013-04-30 DIAGNOSIS — I10 Essential (primary) hypertension: Secondary | ICD-10-CM

## 2013-04-30 DIAGNOSIS — I2510 Atherosclerotic heart disease of native coronary artery without angina pectoris: Secondary | ICD-10-CM

## 2013-04-30 DIAGNOSIS — E782 Mixed hyperlipidemia: Secondary | ICD-10-CM

## 2013-04-30 NOTE — Assessment & Plan Note (Signed)
LDL has been aggressively controlled. Keep follow up with Dr. Everette Rank. He continues on Zocor.

## 2013-04-30 NOTE — Assessment & Plan Note (Signed)
On Norvasc and Cozaar. Keep follow with Dr. Everette Rank.

## 2013-04-30 NOTE — Assessment & Plan Note (Signed)
Symptomatically stable medical therapy. Continue observation. Followup arranged.

## 2013-04-30 NOTE — Patient Instructions (Signed)
Your physician wants you to follow-up in: 6 months  You will receive a reminder letter in the mail two months in advance. If you don't receive a letter, please call our office to schedule the follow-up appointment.  Your physician recommends that you continue on your current medications as directed. Please refer to the Current Medication list given to you today.  

## 2013-04-30 NOTE — Progress Notes (Signed)
Clinical Summary Jay Lester is an 80 y.o.male last seen in July 2014. He reports no angina symptoms. Since last visit he did have to increase his Norvasc dose back to 10 mg daily due to blood pressure going back up. Fortunately, leg swelling is better anyway.  Lab work from July 2014 showed potassium 4.4, BUN 14, creatinine 1.1, normal AST and ALT, cholesterol 94, triglycerides 68, HDL 41, LDL 39.  Lexiscan Myoview from July 2012 showed evidence of scar in a small region of the anterior wall at the base, also base of the inferior wall, LVEF 52%, no active ischemia.  No Known Allergies  Current Outpatient Prescriptions  Medication Sig Dispense Refill  . albuterol (PROAIR HFA) 108 (90 BASE) MCG/ACT inhaler Inhale 2 puffs into the lungs every 6 (six) hours as needed. Shortness of breath      . amLODipine (NORVASC) 5 MG tablet Take 10 mg by mouth daily.      Marland Kitchen aspirin 81 MG EC tablet Take 81 mg by mouth daily. Takes every once in a while      . LORazepam (ATIVAN) 0.5 MG tablet       . losartan (COZAAR) 100 MG tablet Take 1 tablet (100 mg total) by mouth daily.  90 tablet  3  . metoprolol succinate (TOPROL-XL) 25 MG 24 hr tablet TAKE 1/2 TABLET BY MOUTH ONCE DAILY.  15 tablet  6  . Multiple Vitamin (MULTIVITAMIN) tablet Take 1 tablet by mouth daily.        . nitroGLYCERIN (NITROSTAT) 0.4 MG SL tablet Place 1 tablet (0.4 mg total) under the tongue every 5 (five) minutes as needed.  100 tablet  3  . polyethylene glycol powder (GLYCOLAX/MIRALAX) powder MIX 1 CAPFUL (17G) IN 8 OUNCES OF JUICE/WATER AND DRINK ONCE DAILY.  527 g  5  . simvastatin (ZOCOR) 10 MG tablet TAKE (1) TABLET BY MOUTH AT BEDTIME.  30 tablet  6  . tamsulosin (FLOMAX) 0.4 MG CAPS        No current facility-administered medications for this visit.    Past Medical History  Diagnosis Date  . Skin cancer   . Hypertension   . Coronary artery disease     Multivessel, LVEF 60%  . Hyperlipidemia   . Diverticulosis 2010  .  Tubular adenoma 2010  . Redundant colon 2010    Past Surgical History  Procedure Laterality Date  . Coronary artery bypass graft  1994    LIMA to LAD, SVG to first and second diagonal, SVG to OM, SVG to PDA  . Colonoscopy  10/29/2008    Dr. Gala Romney- incomplete, redundant elongated colon precluded complete exam, suboptimal prep, diffuse diverticulosis, 2 tubular adenomas  . Colonoscopy  12/09/08    Virtual Colonoscopy-normal    Social History Jay Lester reports that he quit smoking about 21 years ago. He does not have any smokeless tobacco history on file. Jay Lester reports that he does not drink alcohol.  Review of Systems Recent cold symptoms. Chest congestion. No fevers or chills. Otherwise negative except as outlined.  Physical Examination Filed Vitals:   04/30/13 1023  BP: 158/78  Pulse: 73   Filed Weights   04/30/13 1023  Weight: 155 lb (70.308 kg)    Comfortable at rest.  HEENT: Conjunctiva and lids normal, oropharynx clear.  Neck: Supple, no elevated JVP or carotid bruits, no thyromegaly.  Lungs: Clear to auscultation, nonlabored breathing at rest.  Cardiac: Regular rate and rhythm, no S3, soft systolic murmur,  no pericardial rub.  Abdomen: Soft, nontender, bowel sounds present.  Extremities: No pitting edema, distal pulses 1+.  Neuropsychiatric: Alert and oriented x3, affect grossly appropriate.   Problem List and Plan   CORONARY ATHEROSCLEROSIS NATIVE CORONARY ARTERY Symptomatically stable medical therapy. Continue observation. Followup arranged.  Mixed hyperlipidemia LDL has been aggressively controlled. Keep follow up with Dr. Everette Rank. He continues on Zocor.  Essential hypertension, benign On Norvasc and Cozaar. Keep follow with Dr. Everette Rank.    Satira Sark, M.D., F.A.C.C.

## 2013-05-20 ENCOUNTER — Other Ambulatory Visit: Payer: Self-pay | Admitting: Adult Health

## 2013-05-20 ENCOUNTER — Other Ambulatory Visit: Payer: Self-pay | Admitting: Cardiology

## 2013-06-17 ENCOUNTER — Other Ambulatory Visit: Payer: Self-pay | Admitting: Cardiology

## 2013-10-25 ENCOUNTER — Ambulatory Visit (INDEPENDENT_AMBULATORY_CARE_PROVIDER_SITE_OTHER): Payer: Medicare Other | Admitting: Cardiology

## 2013-10-25 ENCOUNTER — Encounter: Payer: Self-pay | Admitting: Cardiology

## 2013-10-25 VITALS — BP 138/82 | HR 64 | Ht 68.0 in | Wt 152.0 lb

## 2013-10-25 DIAGNOSIS — I10 Essential (primary) hypertension: Secondary | ICD-10-CM

## 2013-10-25 DIAGNOSIS — I2510 Atherosclerotic heart disease of native coronary artery without angina pectoris: Secondary | ICD-10-CM

## 2013-10-25 DIAGNOSIS — E782 Mixed hyperlipidemia: Secondary | ICD-10-CM

## 2013-10-25 MED ORDER — METOPROLOL SUCCINATE ER 25 MG PO TB24: ORAL_TABLET | ORAL | Status: DC

## 2013-10-25 MED ORDER — LOSARTAN POTASSIUM 100 MG PO TABS: ORAL_TABLET | ORAL | Status: DC

## 2013-10-25 MED ORDER — AMLODIPINE BESYLATE 5 MG PO TABS: 10.0000 mg | ORAL_TABLET | Freq: Every day | ORAL | Status: DC

## 2013-10-25 MED ORDER — SIMVASTATIN 10 MG PO TABS: ORAL_TABLET | ORAL | Status: DC

## 2013-10-25 NOTE — Progress Notes (Signed)
Clinical Summary Jay Lester is an 80 y.o.male last seen in January of this year. He reports no angina symptoms or progressive shortness of breath. Takes care of his ADLs and basic chores. No regular exercise regimen. He reports compliance with his medications.  ECG today shows sinus rhythm with prolonged PR interval and right bundle branch block.  Lexiscan Myoview from July 2012 showed evidence of scar in a small region of the anterior wall at the base, also base of the inferior wall, LVEF 52%, no active ischemia.  He will be seeing Dr. Everette Rank later in the year for a physical with lab work.  No Known Allergies  Current Outpatient Prescriptions  Medication Sig Dispense Refill  . albuterol (PROAIR HFA) 108 (90 BASE) MCG/ACT inhaler Inhale 2 puffs into the lungs every 6 (six) hours as needed. Shortness of breath      . amLODipine (NORVASC) 5 MG tablet Take 2 tablets (10 mg total) by mouth daily.  60 tablet  6  . aspirin 81 MG EC tablet Take 81 mg by mouth 3 (three) times a week. Takes every once in a while      . LORazepam (ATIVAN) 0.5 MG tablet       . losartan (COZAAR) 100 MG tablet TAKE ONE TABLET BY MOUTH ONCE DAILY.  30 tablet  6  . metoprolol succinate (TOPROL-XL) 25 MG 24 hr tablet TAKE 1/2 TABLET BY MOUTH ONCE DAILY.  15 tablet  6  . Multiple Vitamin (MULTIVITAMIN) tablet Take 1 tablet by mouth daily.        . nitroGLYCERIN (NITROSTAT) 0.4 MG SL tablet Place 1 tablet (0.4 mg total) under the tongue every 5 (five) minutes as needed.  100 tablet  3  . polyethylene glycol powder (GLYCOLAX/MIRALAX) powder MIX 1 CAPFUL (17G) IN 8 OUNCES OF JUICE/WATER AND DRINK ONCE DAILY.  527 g  5  . simvastatin (ZOCOR) 10 MG tablet TAKE (1) TABLET BY MOUTH AT BEDTIME.  30 tablet  6  . tamsulosin (FLOMAX) 0.4 MG CAPS        No current facility-administered medications for this visit.    Past Medical History  Diagnosis Date  . Skin cancer   . Hypertension   . Coronary artery disease    Multivessel, LVEF 60%  . Hyperlipidemia   . Diverticulosis 2010  . Tubular adenoma 2010  . Redundant colon 2010    Past Surgical History  Procedure Laterality Date  . Coronary artery bypass graft  1994    LIMA to LAD, SVG to first and second diagonal, SVG to OM, SVG to PDA  . Colonoscopy  10/29/2008    Dr. Gala Romney- incomplete, redundant elongated colon precluded complete exam, suboptimal prep, diffuse diverticulosis, 2 tubular adenomas  . Colonoscopy  12/09/08    Virtual Colonoscopy-normal    Social History Jay Lester reports that he quit smoking about 21 years ago. He does not have any smokeless tobacco history on file. Jay Lester reports that he does not drink alcohol.  Review of Systems No palpitations, dizziness, syncope. Stable appetite. No bleeding episodes. Mild lower leg edema around the sock line. No major change in weight. Other systems reviewed and negative.  Physical Examination Filed Vitals:   10/25/13 1111  BP: 138/82  Pulse: 64   Filed Weights   10/25/13 1111  Weight: 152 lb (68.947 kg)    Comfortable at rest.  HEENT: Conjunctiva and lids normal, oropharynx clear.  Neck: Supple, no elevated JVP or carotid bruits, no thyromegaly.  Lungs: Clear to auscultation, nonlabored breathing at rest.  Cardiac: Regular rate and rhythm, no S3, soft systolic murmur, no pericardial rub.  Abdomen: Soft, nontender, bowel sounds present.  Extremities: Trace ankle edema, distal pulses 1+.  Neuropsychiatric: Alert and oriented x3, affect grossly appropriate.   Problem List and Plan   CORONARY ATHEROSCLEROSIS NATIVE CORONARY ARTERY Stable on medical therapy, ECG reviewed and also stable. No changes were made to current regimen. Followup in 6 months.  Essential hypertension, benign No change to current regimen, keep followup with Dr. Everette Rank.  Mixed hyperlipidemia Continues on Zocor, followup lab work Dr. Everette Rank.    Satira Sark, M.D., F.A.C.C.

## 2013-10-25 NOTE — Assessment & Plan Note (Signed)
Stable on medical therapy, ECG reviewed and also stable. No changes were made to current regimen. Followup in 6 months.

## 2013-10-25 NOTE — Assessment & Plan Note (Signed)
Continues on Zocor, followup lab work Dr. Everette Rank.

## 2013-10-25 NOTE — Assessment & Plan Note (Signed)
No change to current regimen, keep followup with Dr. Everette Rank.

## 2013-10-25 NOTE — Patient Instructions (Signed)
Your physician wants you to follow-up in: 6 months with Dr. McDowell You will receive a reminder letter in the mail two months in advance. If you don't receive a letter, please call our office to schedule the follow-up appointment.  Your physician recommends that you continue on your current medications as directed. Please refer to the Current Medication list given to you today.  Thank you for choosing [facility]!!    

## 2014-03-19 ENCOUNTER — Other Ambulatory Visit: Payer: Self-pay | Admitting: Gastroenterology

## 2014-04-29 ENCOUNTER — Ambulatory Visit (INDEPENDENT_AMBULATORY_CARE_PROVIDER_SITE_OTHER): Payer: Medicare Other | Admitting: Cardiology

## 2014-04-29 ENCOUNTER — Encounter: Payer: Self-pay | Admitting: Cardiology

## 2014-04-29 VITALS — BP 128/86 | HR 65 | Ht 68.0 in | Wt 153.0 lb

## 2014-04-29 DIAGNOSIS — I10 Essential (primary) hypertension: Secondary | ICD-10-CM

## 2014-04-29 DIAGNOSIS — E782 Mixed hyperlipidemia: Secondary | ICD-10-CM

## 2014-04-29 DIAGNOSIS — I2510 Atherosclerotic heart disease of native coronary artery without angina pectoris: Secondary | ICD-10-CM

## 2014-04-29 NOTE — Assessment & Plan Note (Signed)
Blood pressure control is fairly good today, no change in current regimen.

## 2014-04-29 NOTE — Progress Notes (Signed)
Reason for visit: CAD  Clinical Summary [name] is an 81 y.o.male last seen in July 2015. He presents for a routine follow-up visit. No angina symptoms reported, NYHA class II dyspnea at baseline. We reviewed his medications. He reports compliance with aspirin, Norvasc, Cozaar, Toprol-XL, and Zocor. He has not required any nitroglycerin.  Lab work has been followed by Dr. Everette Rank. Patient does report recent flu-like illness. Currently completing a course of antibiotics.  Lexiscan Myoview from July 2012 showed evidence of scar in a small region of the anterior wall at the base, also base of the inferior wall, LVEF 52%, no active ischemia.  No Known Allergies  Current Outpatient Prescriptions  Medication Sig Dispense Refill  . Chlorphen-Pseudoephed-APAP (CORICIDIN D PO) Take by mouth.    Marland Kitchen guaiFENesin (MUCINEX) 600 MG 12 hr tablet Take 600 mg by mouth 2 (two) times daily.    Marland Kitchen albuterol (PROAIR HFA) 108 (90 BASE) MCG/ACT inhaler Inhale 2 puffs into the lungs every 6 (six) hours as needed. Shortness of breath    . amLODipine (NORVASC) 5 MG tablet Take 2 tablets (10 mg total) by mouth daily. 60 tablet 6  . aspirin 81 MG EC tablet Take 81 mg by mouth 3 (three) times a week. Takes every once in a while    . cefdinir (OMNICEF) 300 MG capsule   0  . LORazepam (ATIVAN) 0.5 MG tablet     . losartan (COZAAR) 100 MG tablet TAKE ONE TABLET BY MOUTH ONCE DAILY. 30 tablet 6  . metoprolol succinate (TOPROL-XL) 25 MG 24 hr tablet TAKE 1/2 TABLET BY MOUTH ONCE DAILY. 15 tablet 6  . Multiple Vitamin (MULTIVITAMIN) tablet Take 1 tablet by mouth daily.      . nitroGLYCERIN (NITROSTAT) 0.4 MG SL tablet Place 1 tablet (0.4 mg total) under the tongue every 5 (five) minutes as needed. 100 tablet 3  . polyethylene glycol powder (GLYCOLAX/MIRALAX) powder MIX 1 CAPFUL (17G) IN 8 OUNCES OF JUICE/WATER AND DRINK ONCE DAILY. 527 g 3  . simvastatin (ZOCOR) 10 MG tablet TAKE (1) TABLET BY MOUTH AT BEDTIME. 30 tablet  6  . tamsulosin (FLOMAX) 0.4 MG CAPS      No current facility-administered medications for this visit.    Past Medical History  Diagnosis Date  . Skin cancer   . Hypertension   . Coronary artery disease     Multivessel, LVEF 60%  . Hyperlipidemia   . Diverticulosis 2010  . Tubular adenoma 2010  . Redundant colon 2010    Past Surgical History  Procedure Laterality Date  . Coronary artery bypass graft  1994    LIMA to LAD, SVG to first and second diagonal, SVG to OM, SVG to PDA  . Colonoscopy  10/29/2008    Dr. Gala Romney- incomplete, redundant elongated colon precluded complete exam, suboptimal prep, diffuse diverticulosis, 2 tubular adenomas  . Colonoscopy  12/09/08    Virtual Colonoscopy-normal    Social History [name] reports that he quit smoking about 22 years ago. He does not have any smokeless tobacco history on file. [name] reports that he does not drink alcohol.  Review of Systems Complete review of systems negative except as otherwise outlined in the clinical summary and also the following. No palpitations or syncope. Stable appetite.  Physical Examination Filed Vitals:   04/29/14 1524  BP: 128/86  Pulse: 65   Filed Weights   04/29/14 1524  Weight: 153 lb (69.4 kg)    Comfortable at rest.  HEENT:  Conjunctiva and lids normal, oropharynx clear.  Neck: Supple, no elevated JVP or carotid bruits, no thyromegaly.  Lungs: Clear to auscultation, nonlabored breathing at rest.  Cardiac: Regular rate and rhythm, no S3, soft systolic murmur, no pericardial rub.  Abdomen: Soft, nontender, bowel sounds present.  Extremities: Trace ankle edema, distal pulses 1+.  Neuropsychiatric: Alert and oriented x3, affect grossly appropriate.   Problem List and Plan   CORONARY ATHEROSCLEROSIS NATIVE CORONARY ARTERY Multivessel disease status post CABG in 1994. Follow-up ischemic testing in 2012 was low risk. He is symptomatically stable. Continue medical therapy and  observation.  Essential hypertension, benign Blood pressure control is fairly good today, no change in current regimen.  Mixed hyperlipidemia He continues on Zocor, lab work followed by Dr. Everette Rank.    Jay Lester, M.D., F.A.C.C.

## 2014-04-29 NOTE — Assessment & Plan Note (Signed)
He continues on Zocor, lab work followed by Dr. Everette Rank.

## 2014-04-29 NOTE — Patient Instructions (Signed)
Your physician wants you to follow-up in: 6 Months with Dr. McDowell. You will receive a reminder letter in the mail two months in advance. If you don't receive a letter, please call our office to schedule the follow-up appointment.   Your physician recommends that you continue on your current medications as directed. Please refer to the Current Medication list given to you today.   Thank you for choosing [facility]!   

## 2014-04-29 NOTE — Assessment & Plan Note (Signed)
Multivessel disease status post CABG in 1994. Follow-up ischemic testing in 2012 was low risk. He is symptomatically stable. Continue medical therapy and observation.

## 2014-07-16 ENCOUNTER — Other Ambulatory Visit: Payer: Self-pay | Admitting: Cardiology

## 2014-10-30 ENCOUNTER — Ambulatory Visit (INDEPENDENT_AMBULATORY_CARE_PROVIDER_SITE_OTHER): Payer: Medicare Other | Admitting: Cardiology

## 2014-10-30 ENCOUNTER — Encounter: Payer: Self-pay | Admitting: Cardiology

## 2014-10-30 VITALS — BP 128/72 | HR 81 | Ht 68.0 in | Wt 148.0 lb

## 2014-10-30 DIAGNOSIS — E782 Mixed hyperlipidemia: Secondary | ICD-10-CM

## 2014-10-30 DIAGNOSIS — I2510 Atherosclerotic heart disease of native coronary artery without angina pectoris: Secondary | ICD-10-CM | POA: Diagnosis not present

## 2014-10-30 DIAGNOSIS — I10 Essential (primary) hypertension: Secondary | ICD-10-CM | POA: Diagnosis not present

## 2014-10-30 NOTE — Patient Instructions (Addendum)
Your physician wants you to follow-up in: 6 MONTHS WITH DR. MCDOWELL. You will receive a reminder letter in the mail two months in advance. If you don't receive a letter, please call our office to schedule the follow-up appointment.   Your physician recommends that you continue on your current medications as directed. Please refer to the Current Medication list given to you today.  Thanks for choosing [facility]!!!   

## 2014-10-30 NOTE — Progress Notes (Signed)
Cardiology Office Note  Date: 10/30/2014   ID: Luther Redo, DOB 14-Dec-1923, MRN [id number]  PCP: Lanette Hampshire, MD  Primary Cardiologist: Rozann Lesches, MD   Chief Complaint  Patient presents with  . Coronary Artery Disease  . Hyperlipidemia    History of Present Illness: Jay Lester is a 81 y.o. male last seen in January. He presents for a routine follow-up visit. Since we last spoke, he denies having any recurrent angina symptoms, has not used any nitroglycerin. Follow-up ECG is outlined below. We discussed his medications which have been stable from a cardiac perspective.  He continues to follow with Dr. Everette Rank, due for a follow-up physical with lab work.  He remains functional in his ADLs, no regular exercise plan at this time.   Past Medical History  Diagnosis Date  . Skin cancer   . Hypertension   . Coronary artery disease     Multivessel, LVEF 60%  . Hyperlipidemia   . Diverticulosis 2010  . Tubular adenoma 2010  . Redundant colon 2010    Past Surgical History  Procedure Laterality Date  . Coronary artery bypass graft  1994    LIMA to LAD, SVG to first and second diagonal, SVG to OM, SVG to PDA  . Colonoscopy  10/29/2008    Dr. Gala Romney- incomplete, redundant elongated colon precluded complete exam, suboptimal prep, diffuse diverticulosis, 2 tubular adenomas  . Colonoscopy  12/09/08    Virtual Colonoscopy-normal    Current Outpatient Prescriptions  Medication Sig Dispense Refill  . albuterol (PROAIR HFA) 108 (90 BASE) MCG/ACT inhaler Inhale 2 puffs into the lungs every 6 (six) hours as needed. Shortness of breath    . amLODipine (NORVASC) 10 MG tablet TAKE 1 TABLET BY MOUTH ONCE DAILY. 30 tablet 11  . Chlorphen-Pseudoephed-APAP (CORICIDIN D PO) Take by mouth.    Marland Kitchen guaiFENesin (MUCINEX) 600 MG 12 hr tablet Take 600 mg by mouth 2 (two) times daily.    Marland Kitchen LORazepam (ATIVAN) 0.5 MG tablet     . losartan (COZAAR) 100 MG tablet TAKE ONE TABLET BY MOUTH ONCE  DAILY. 30 tablet 6  . metoprolol succinate (TOPROL-XL) 25 MG 24 hr tablet TAKE 1/2 TABLET BY MOUTH ONCE DAILY. 15 tablet 11  . Multiple Vitamin (MULTIVITAMIN) tablet Take 1 tablet by mouth daily.      . nitroGLYCERIN (NITROSTAT) 0.4 MG SL tablet Place 1 tablet (0.4 mg total) under the tongue every 5 (five) minutes as needed. 100 tablet 3  . polyethylene glycol powder (GLYCOLAX/MIRALAX) powder MIX 1 CAPFUL (17G) IN 8 OUNCES OF JUICE/WATER AND DRINK ONCE DAILY. 527 g 3  . simvastatin (ZOCOR) 10 MG tablet TAKE 1 TABLET BY MOUTH AT BEDTIME. 30 tablet 11  . tamsulosin (FLOMAX) 0.4 MG CAPS      No current facility-administered medications for this visit.    Allergies:  Review of patient's allergies indicates no known allergies.   Social History: The patient  reports that he quit smoking about 22 years ago. He does not have any smokeless tobacco history on file. He reports that he does not drink alcohol or use illicit drugs.   ROS:  Please see the history of present illness. Otherwise, complete review of systems is positive for decreased hearing.  All other systems are reviewed and negative.   Physical Exam: VS:  BP 128/72 mmHg  Pulse 81  Ht 5\' 8"  (1.727 m)  Wt 148 lb (67.132 kg)  BMI 22.51 kg/m2  SpO2 93%, BMI Body mass index is  22.51 kg/(m^2).  Wt Readings from Last 3 Encounters:  10/30/14 148 lb (67.132 kg)  04/29/14 153 lb (69.4 kg)  10/25/13 152 lb (68.947 kg)     Comfortable at rest.  HEENT: Conjunctiva and lids normal, oropharynx clear.  Neck: Supple, no elevated JVP or carotid bruits, no thyromegaly.  Lungs: Clear to auscultation, nonlabored breathing at rest.  Cardiac: Regular rate and rhythm, no S3, soft systolic murmur, no pericardial rub.  Abdomen: Soft, nontender, bowel sounds present.  Extremities: Trace ankle edema, distal pulses 1+.  Neuropsychiatric: Alert and oriented x3, affect grossly appropriate.   ECG: ECG is ordered today and shows sinus rhythm with  right bundle branch block, prolonged PR interval.  Recent Labwork:    Component Value Date/Time   CHOL 94 10/30/2012 0725   TRIG 68 10/30/2012 0725   HDL 41 10/30/2012 0725   CHOLHDL 2.3 10/30/2012 0725   VLDL 14 10/30/2012 0725   LDLCALC 39 10/30/2012 0725    Other Studies Reviewed Today:  Leane Call from July 2012 showed evidence of scar in a small region of the anterior wall at the base, also base of the inferior wall, LVEF 52%, no active ischemia.  ASSESSMENT AND PLAN:  1. Multivessel CAD status post previous CABG in 1994. Ischemic evaluation within the last 4 years was low risk, and he reports no recurrent angina symptoms. Continue medical therapy and observation.  2. History of hyperlipidemia, on low-dose Zocor. He continues to follow with Dr. Everette Rank.  3. Essential hypertension, blood pressure control is good today.  Current medicines were reviewed at length with the patient today.   Orders Placed This Encounter  Procedures  . EKG 12-Lead    Disposition: FU with me in 6 months.   Signed, Satira Sark, MD, [facility] 10/30/2014 10:48 AM    [facility]. [address], [place], [place] [address] Phone: [phone]; Fax: [phone]

## 2014-11-18 ENCOUNTER — Other Ambulatory Visit: Payer: Self-pay | Admitting: Cardiology

## 2014-12-06 ENCOUNTER — Emergency Department (HOSPITAL_COMMUNITY): Payer: Medicare Other

## 2014-12-06 ENCOUNTER — Inpatient Hospital Stay (HOSPITAL_COMMUNITY)
Admission: EM | Admit: 2014-12-06 | Discharge: 2015-01-17 | DRG: 025 | Disposition: E | Payer: Medicare Other | Attending: Internal Medicine | Admitting: Internal Medicine

## 2014-12-06 ENCOUNTER — Encounter (HOSPITAL_COMMUNITY): Payer: Self-pay

## 2014-12-06 DIAGNOSIS — K913 Postprocedural intestinal obstruction: Secondary | ICD-10-CM | POA: Diagnosis not present

## 2014-12-06 DIAGNOSIS — Z4659 Encounter for fitting and adjustment of other gastrointestinal appliance and device: Secondary | ICD-10-CM

## 2014-12-06 DIAGNOSIS — I2510 Atherosclerotic heart disease of native coronary artery without angina pectoris: Secondary | ICD-10-CM | POA: Diagnosis present

## 2014-12-06 DIAGNOSIS — R569 Unspecified convulsions: Secondary | ICD-10-CM | POA: Diagnosis not present

## 2014-12-06 DIAGNOSIS — Z09 Encounter for follow-up examination after completed treatment for conditions other than malignant neoplasm: Secondary | ICD-10-CM

## 2014-12-06 DIAGNOSIS — E43 Unspecified severe protein-calorie malnutrition: Secondary | ICD-10-CM | POA: Diagnosis present

## 2014-12-06 DIAGNOSIS — Z66 Do not resuscitate: Secondary | ICD-10-CM | POA: Diagnosis not present

## 2014-12-06 DIAGNOSIS — Z951 Presence of aortocoronary bypass graft: Secondary | ICD-10-CM | POA: Diagnosis not present

## 2014-12-06 DIAGNOSIS — Z79899 Other long term (current) drug therapy: Secondary | ICD-10-CM

## 2014-12-06 DIAGNOSIS — E785 Hyperlipidemia, unspecified: Secondary | ICD-10-CM | POA: Diagnosis present

## 2014-12-06 DIAGNOSIS — I6200 Nontraumatic subdural hemorrhage, unspecified: Secondary | ICD-10-CM | POA: Diagnosis not present

## 2014-12-06 DIAGNOSIS — R4701 Aphasia: Secondary | ICD-10-CM | POA: Diagnosis present

## 2014-12-06 DIAGNOSIS — G40909 Epilepsy, unspecified, not intractable, without status epilepticus: Secondary | ICD-10-CM | POA: Diagnosis not present

## 2014-12-06 DIAGNOSIS — R531 Weakness: Secondary | ICD-10-CM | POA: Diagnosis present

## 2014-12-06 DIAGNOSIS — Z978 Presence of other specified devices: Secondary | ICD-10-CM

## 2014-12-06 DIAGNOSIS — S065X1S Traumatic subdural hemorrhage with loss of consciousness of 30 minutes or less, sequela: Secondary | ICD-10-CM | POA: Diagnosis not present

## 2014-12-06 DIAGNOSIS — S065X9A Traumatic subdural hemorrhage with loss of consciousness of unspecified duration, initial encounter: Secondary | ICD-10-CM | POA: Diagnosis present

## 2014-12-06 DIAGNOSIS — R069 Unspecified abnormalities of breathing: Secondary | ICD-10-CM

## 2014-12-06 DIAGNOSIS — I6201 Nontraumatic acute subdural hemorrhage: Secondary | ICD-10-CM | POA: Diagnosis present

## 2014-12-06 DIAGNOSIS — Z515 Encounter for palliative care: Secondary | ICD-10-CM

## 2014-12-06 DIAGNOSIS — J9690 Respiratory failure, unspecified, unspecified whether with hypoxia or hypercapnia: Secondary | ICD-10-CM

## 2014-12-06 DIAGNOSIS — N179 Acute kidney failure, unspecified: Secondary | ICD-10-CM | POA: Diagnosis not present

## 2014-12-06 DIAGNOSIS — A419 Sepsis, unspecified organism: Secondary | ICD-10-CM | POA: Diagnosis not present

## 2014-12-06 DIAGNOSIS — Z452 Encounter for adjustment and management of vascular access device: Secondary | ICD-10-CM

## 2014-12-06 DIAGNOSIS — R059 Cough, unspecified: Secondary | ICD-10-CM

## 2014-12-06 DIAGNOSIS — R471 Dysarthria and anarthria: Secondary | ICD-10-CM | POA: Diagnosis present

## 2014-12-06 DIAGNOSIS — K567 Ileus, unspecified: Secondary | ICD-10-CM

## 2014-12-06 DIAGNOSIS — Z87891 Personal history of nicotine dependence: Secondary | ICD-10-CM | POA: Diagnosis not present

## 2014-12-06 DIAGNOSIS — K56609 Unspecified intestinal obstruction, unspecified as to partial versus complete obstruction: Secondary | ICD-10-CM

## 2014-12-06 DIAGNOSIS — Z800 Family history of malignant neoplasm of digestive organs: Secondary | ICD-10-CM

## 2014-12-06 DIAGNOSIS — J690 Pneumonitis due to inhalation of food and vomit: Secondary | ICD-10-CM | POA: Diagnosis not present

## 2014-12-06 DIAGNOSIS — S065XAA Traumatic subdural hemorrhage with loss of consciousness status unknown, initial encounter: Secondary | ICD-10-CM | POA: Diagnosis present

## 2014-12-06 DIAGNOSIS — R40242 Glasgow coma scale score 9-12: Secondary | ICD-10-CM | POA: Diagnosis present

## 2014-12-06 DIAGNOSIS — G8191 Hemiplegia, unspecified affecting right dominant side: Secondary | ICD-10-CM | POA: Diagnosis present

## 2014-12-06 DIAGNOSIS — J9601 Acute respiratory failure with hypoxia: Secondary | ICD-10-CM | POA: Diagnosis not present

## 2014-12-06 DIAGNOSIS — Z0189 Encounter for other specified special examinations: Secondary | ICD-10-CM

## 2014-12-06 DIAGNOSIS — R2990 Unspecified symptoms and signs involving the nervous system: Secondary | ICD-10-CM

## 2014-12-06 DIAGNOSIS — R4182 Altered mental status, unspecified: Secondary | ICD-10-CM

## 2014-12-06 DIAGNOSIS — Z7189 Other specified counseling: Secondary | ICD-10-CM | POA: Diagnosis present

## 2014-12-06 DIAGNOSIS — I10 Essential (primary) hypertension: Secondary | ICD-10-CM | POA: Diagnosis present

## 2014-12-06 DIAGNOSIS — K310 Acute dilatation of stomach: Secondary | ICD-10-CM

## 2014-12-06 DIAGNOSIS — R05 Cough: Secondary | ICD-10-CM

## 2014-12-06 DIAGNOSIS — K9189 Other postprocedural complications and disorders of digestive system: Secondary | ICD-10-CM

## 2014-12-06 LAB — PROTIME-INR
INR: 1.23 (ref 0.00–1.49)
PROTHROMBIN TIME: 15.7 s — AB (ref 11.6–15.2)

## 2014-12-06 LAB — URINALYSIS, ROUTINE W REFLEX MICROSCOPIC
Bilirubin Urine: NEGATIVE
Glucose, UA: NEGATIVE mg/dL
Ketones, ur: NEGATIVE mg/dL
LEUKOCYTES UA: NEGATIVE
Nitrite: NEGATIVE
PROTEIN: NEGATIVE mg/dL
UROBILINOGEN UA: 0.2 mg/dL (ref 0.0–1.0)
pH: 6 (ref 5.0–8.0)

## 2014-12-06 LAB — CBC WITH DIFFERENTIAL/PLATELET
BASOS ABS: 0 10*3/uL (ref 0.0–0.1)
BASOS PCT: 0 % (ref 0–1)
BASOS PCT: 0 % (ref 0–1)
Basophils Absolute: 0 10*3/uL (ref 0.0–0.1)
EOS ABS: 0.2 10*3/uL (ref 0.0–0.7)
EOS PCT: 2 % (ref 0–5)
EOS PCT: 3 % (ref 0–5)
Eosinophils Absolute: 0.2 10*3/uL (ref 0.0–0.7)
HCT: 36.9 % — ABNORMAL LOW (ref 39.0–52.0)
HCT: 36.6 % — ABNORMAL LOW (ref 39.0–52.0)
HEMOGLOBIN: 12.6 g/dL — AB (ref 13.0–17.0)
Hemoglobin: 12.4 g/dL — ABNORMAL LOW (ref 13.0–17.0)
LYMPHS PCT: 34 % (ref 12–46)
Lymphocytes Relative: 44 % (ref 12–46)
Lymphs Abs: 3.6 10*3/uL (ref 0.7–4.0)
Lymphs Abs: 2.6 10*3/uL (ref 0.7–4.0)
MCH: 31.3 pg (ref 26.0–34.0)
MCH: 31.2 pg (ref 26.0–34.0)
MCHC: 34.1 g/dL (ref 30.0–36.0)
MCHC: 33.9 g/dL (ref 30.0–36.0)
MCV: 91.8 fL (ref 78.0–100.0)
MCV: 92 fL (ref 78.0–100.0)
MONO ABS: 0.7 10*3/uL (ref 0.1–1.0)
Monocytes Absolute: 0.6 10*3/uL (ref 0.1–1.0)
Monocytes Relative: 8 % (ref 3–12)
Monocytes Relative: 9 % (ref 3–12)
NEUTROS ABS: 4.1 10*3/uL (ref 1.7–7.7)
NEUTROS PCT: 46 % (ref 43–77)
Neutro Abs: 3.8 10*3/uL (ref 1.7–7.7)
Neutrophils Relative %: 54 % (ref 43–77)
PLATELETS: 139 10*3/uL — AB (ref 150–400)
PLATELETS: 125 10*3/uL — AB (ref 150–400)
RBC: 4.02 MIL/uL — AB (ref 4.22–5.81)
RBC: 3.98 MIL/uL — AB (ref 4.22–5.81)
RDW: 14.2 % (ref 11.5–15.5)
RDW: 14 % (ref 11.5–15.5)
WBC: 8.2 10*3/uL (ref 4.0–10.5)
WBC: 7.6 10*3/uL (ref 4.0–10.5)

## 2014-12-06 LAB — COMPREHENSIVE METABOLIC PANEL
ALBUMIN: 4.1 g/dL (ref 3.5–5.0)
ALK PHOS: 45 U/L (ref 38–126)
ALT: 14 U/L — AB (ref 17–63)
ANION GAP: 8 (ref 5–15)
AST: 21 U/L (ref 15–41)
BUN: 24 mg/dL — ABNORMAL HIGH (ref 6–20)
CALCIUM: 9.2 mg/dL (ref 8.9–10.3)
CHLORIDE: 102 mmol/L (ref 101–111)
CO2: 27 mmol/L (ref 22–32)
Creatinine, Ser: 1.13 mg/dL (ref 0.61–1.24)
GFR calc non Af Amer: 56 mL/min — ABNORMAL LOW (ref >60)
GLUCOSE: 101 mg/dL — AB (ref 65–99)
Potassium: 4.2 mmol/L (ref 3.5–5.1)
SODIUM: 137 mmol/L (ref 135–145)
Total Bilirubin: 0.9 mg/dL (ref 0.3–1.2)
Total Protein: 6.5 g/dL (ref 6.5–8.1)

## 2014-12-06 LAB — LACTIC ACID, PLASMA: LACTIC ACID, VENOUS: 0.7 mmol/L (ref 0.5–2.0)

## 2014-12-06 LAB — BASIC METABOLIC PANEL
ANION GAP: 7 (ref 5–15)
BUN: 16 mg/dL (ref 6–20)
CALCIUM: 9.5 mg/dL (ref 8.9–10.3)
CO2: 30 mmol/L (ref 22–32)
Chloride: 104 mmol/L (ref 101–111)
Creatinine, Ser: 1.14 mg/dL (ref 0.61–1.24)
GFR, EST NON AFRICAN AMERICAN: 55 mL/min — AB (ref >60)
Glucose, Bld: 108 mg/dL — ABNORMAL HIGH (ref 65–99)
POTASSIUM: 4.1 mmol/L (ref 3.5–5.1)
Sodium: 141 mmol/L (ref 135–145)

## 2014-12-06 LAB — URINE MICROSCOPIC-ADD ON

## 2014-12-06 LAB — TROPONIN I: Troponin I: <0.03 ng/mL (ref <0.031)

## 2014-12-06 LAB — APTT: APTT: 35 s (ref 24–37)

## 2014-12-06 MED ORDER — ALBUTEROL SULFATE HFA 108 (90 BASE) MCG/ACT IN AERS: 2.0000 | INHALATION_SPRAY | Freq: Four times a day (QID) | RESPIRATORY_TRACT | Status: DC | PRN

## 2014-12-06 MED ORDER — LEVETIRACETAM 750 MG PO TABS
750.0000 mg | ORAL_TABLET | Freq: Two times a day (BID) | ORAL | Status: DC
  Administered 2014-12-07 – 2014-12-09 (×6): 750 mg via ORAL
  Filled 2014-12-06 (×9): qty 1

## 2014-12-06 MED ORDER — ALBUTEROL SULFATE (2.5 MG/3ML) 0.083% IN NEBU
2.5000 mg | INHALATION_SOLUTION | Freq: Four times a day (QID) | RESPIRATORY_TRACT | Status: DC | PRN
  Administered 2014-12-12 – 2014-12-18 (×3): 2.5 mg via RESPIRATORY_TRACT
  Filled 2014-12-06 (×4): qty 3

## 2014-12-06 MED ORDER — SODIUM CHLORIDE 0.9 % IV SOLN
INTRAVENOUS | Status: DC
  Administered 2014-12-09 – 2014-12-19 (×5): via INTRAVENOUS

## 2014-12-06 MED ORDER — LOSARTAN POTASSIUM 50 MG PO TABS
100.0000 mg | ORAL_TABLET | Freq: Every day | ORAL | Status: DC
  Administered 2014-12-07 – 2014-12-12 (×5): 100 mg via ORAL
  Filled 2014-12-06 (×8): qty 2

## 2014-12-06 MED ORDER — ONDANSETRON HCL 4 MG/2ML IJ SOLN
4.0000 mg | Freq: Four times a day (QID) | INTRAMUSCULAR | Status: DC | PRN
  Administered 2014-12-08 – 2014-12-14 (×4): 4 mg via INTRAVENOUS
  Filled 2014-12-06 (×3): qty 2

## 2014-12-06 MED ORDER — AMLODIPINE BESYLATE 10 MG PO TABS
10.0000 mg | ORAL_TABLET | Freq: Every day | ORAL | Status: DC
  Administered 2014-12-07 – 2014-12-12 (×5): 10 mg via ORAL
  Filled 2014-12-06 (×8): qty 1

## 2014-12-06 MED ORDER — ACETAMINOPHEN 325 MG PO TABS
650.0000 mg | ORAL_TABLET | Freq: Four times a day (QID) | ORAL | Status: DC | PRN
  Administered 2014-12-08 – 2014-12-12 (×6): 650 mg via ORAL
  Filled 2014-12-06 (×7): qty 2

## 2014-12-06 MED ORDER — ALFUZOSIN HCL ER 10 MG PO TB24
10.0000 mg | ORAL_TABLET | Freq: Every day | ORAL | Status: DC
Start: 2014-12-07 — End: 2014-12-13
  Administered 2014-12-07 – 2014-12-12 (×6): 10 mg via ORAL
  Filled 2014-12-06 (×7): qty 1

## 2014-12-06 MED ORDER — CEFAZOLIN SODIUM-DEXTROSE 2-3 GM-% IV SOLR
2.0000 g | Freq: Once | INTRAVENOUS | Status: AC
  Administered 2014-12-08: 2 g via INTRAVENOUS
  Filled 2014-12-06: qty 50

## 2014-12-06 MED ORDER — METOPROLOL SUCCINATE 12.5 MG HALF TABLET
12.5000 mg | ORAL_TABLET | Freq: Every day | ORAL | Status: DC
Start: 2014-12-07 — End: 2014-12-13
  Administered 2014-12-07 – 2014-12-12 (×6): 12.5 mg via ORAL
  Filled 2014-12-06 (×7): qty 1

## 2014-12-06 MED ORDER — SODIUM CHLORIDE 0.9 % IV SOLN
INTRAVENOUS | Status: DC
  Administered 2014-12-06: 22:00:00 via INTRAVENOUS

## 2014-12-06 MED ORDER — NITROGLYCERIN 0.4 MG SL SUBL: 0.4000 mg | SUBLINGUAL_TABLET | SUBLINGUAL | Status: DC | PRN

## 2014-12-06 MED ORDER — HYDROCODONE-ACETAMINOPHEN 5-325 MG PO TABS
1.0000 | ORAL_TABLET | Freq: Four times a day (QID) | ORAL | Status: DC | PRN
  Administered 2014-12-10 – 2014-12-11 (×2): 1 via ORAL
  Filled 2014-12-06 (×2): qty 1

## 2014-12-06 MED ORDER — SIMVASTATIN 10 MG PO TABS
10.0000 mg | ORAL_TABLET | Freq: Every day | ORAL | Status: DC
  Administered 2014-12-06 – 2014-12-13 (×8): 10 mg via ORAL
  Filled 2014-12-06 (×14): qty 1

## 2014-12-06 MED ORDER — LORAZEPAM 0.5 MG PO TABS: 0.5000 mg | ORAL_TABLET | Freq: Every evening | ORAL | Status: DC | PRN

## 2014-12-06 MED ORDER — LABETALOL HCL 5 MG/ML IV SOLN
10.0000 mg | INTRAVENOUS | Status: DC | PRN
  Filled 2014-12-06: qty 4

## 2014-12-06 MED ORDER — SODIUM CHLORIDE 0.9 % IV SOLN
1000.0000 mg | Freq: Once | INTRAVENOUS | Status: AC
  Administered 2014-12-06: 1000 mg via INTRAVENOUS
  Filled 2014-12-06: qty 10

## 2014-12-06 NOTE — ED Notes (Signed)
Pt given pack of crackers & half cup of water as per EDP.

## 2014-12-06 NOTE — ED Notes (Signed)
Patient presents with family. Patient was push mowing grass a couple of days ago. Family states patient broke out in a "really bad" sweat. Since then patient has had intermittent numbness in right arm and pain in his right leg. Per family patient never rebounded back to baseline, patient has been weak all over and "stumbling" around the house. Family denies any speech change.

## 2014-12-06 NOTE — ED Provider Notes (Signed)
CSN: [id number]     Arrival date & time 11/25/2014  33 History   First MD Initiated Contact with Patient 11/18/2014 1540     Chief Complaint  Patient presents with  . Weakness     (Consider location/radiation/quality/duration/timing/severity/associated sxs/prior Treatment) Patient is a 81 y.o. male presenting with neurologic complaint.  Neurologic Problem   81 year old male who was mowing the yard on Wednesday had acute onset of diaphoresis, right leg weakness and right arm weakness with paresthesias. This persisted since that time however patient did not want come the hospital so he did it. No nausea, vomiting, fever, back pain, headache, vision changes, facial changes. No history of the same however does have a history of heart disease. No aggravating or relieving symptoms.  Past Medical History  Diagnosis Date  . Skin cancer   . Hypertension   . Coronary artery disease     Multivessel, LVEF 60%  . Hyperlipidemia   . Diverticulosis 2010  . Tubular adenoma 2010  . Redundant colon 2010   Past Surgical History  Procedure Laterality Date  . Coronary artery bypass graft  1994    LIMA to LAD, SVG to first and second diagonal, SVG to OM, SVG to PDA  . Colonoscopy  10/29/2008    Dr. Gala Romney- incomplete, redundant elongated colon precluded complete exam, suboptimal prep, diffuse diverticulosis, 2 tubular adenomas  . Colonoscopy  12/09/08    Virtual Colonoscopy-normal   Family History  Problem Relation Age of Onset  . Colon cancer  61  . Colon cancer  13   Social History  Substance Use Topics  . Smoking status: Former Smoker    Quit date: 04/18/1992  . Smokeless tobacco: None     Comment: Quit 19 yrs ago  . Alcohol Use: No    Review of Systems  Neurological: Positive for weakness and numbness.  All other systems reviewed and are negative.     Allergies  Review of patient's allergies indicates no known allergies.  Home Medications   Prior to Admission medications    Medication Sig Start Date End Date Taking? Authorizing Provider  albuterol (PROAIR HFA) 108 (90 BASE) MCG/ACT inhaler Inhale 2 puffs into the lungs every 6 (six) hours as needed. Shortness of breath    Historical Provider, MD  amLODipine (NORVASC) 10 MG tablet TAKE 1 TABLET BY MOUTH ONCE DAILY. 07/16/14   Satira Sark, MD  Chlorphen-Pseudoephed-APAP (CORICIDIN D PO) Take by mouth.    Historical Provider, MD  guaiFENesin (MUCINEX) 600 MG 12 hr tablet Take 600 mg by mouth 2 (two) times daily.    Historical Provider, MD  LORazepam (ATIVAN) 0.5 MG tablet  09/17/12   Historical Provider, MD  losartan (COZAAR) 100 MG tablet TAKE 1 TABLET BY MOUTH ONCE DAILY. 11/18/14   Satira Sark, MD  metoprolol succinate (TOPROL-XL) 25 MG 24 hr tablet TAKE 1/2 TABLET BY MOUTH ONCE DAILY. 07/16/14   Satira Sark, MD  Multiple Vitamin (MULTIVITAMIN) tablet Take 1 tablet by mouth daily.      Historical Provider, MD  nitroGLYCERIN (NITROSTAT) 0.4 MG SL tablet Place 1 tablet (0.4 mg total) under the tongue every 5 (five) minutes as needed. 06/22/11   Satira Sark, MD  polyethylene glycol powder (GLYCOLAX/MIRALAX) powder MIX 1 CAPFUL (17G) IN 8 OUNCES OF JUICE/WATER AND DRINK ONCE DAILY. 03/20/14   Orvil Feil, NP  simvastatin (ZOCOR) 10 MG tablet TAKE 1 TABLET BY MOUTH AT BEDTIME. 07/16/14   Satira Sark, MD  tamsulosin (  FLOMAX) 0.4 MG CAPS  08/16/12   Historical Provider, MD   BP 165/80 mmHg  Pulse 66  Temp(Src) 98.2 F (36.8 C) (Oral)  Resp 17  Ht 5\' 8"  (1.727 m)  Wt 150 lb (68.04 kg)  BMI 22.81 kg/m2  SpO2 95% Physical Exam  Constitutional: He is oriented to person, place, and time. He appears well-developed and well-nourished.  HENT:  Head: Normocephalic and atraumatic.  Eyes: Conjunctivae and EOM are normal.  Neck: Normal range of motion. Neck supple.  Cardiovascular: Normal rate and regular rhythm.   Pulmonary/Chest: Effort normal. No respiratory distress.  Abdominal: Soft. There is no  tenderness.  Musculoskeletal: Normal range of motion. He exhibits no edema or tenderness.  Neurological: He is alert and oriented to person, place, and time.  No altered mental status, able to give full seemingly accurate history.  Face is symmetric, EOM's intact, pupils equal and reactive, vision intact, tongue and uvula midline without deviation Upper and Lower extremity motor 4/5 on right, 5/5 on left, intact pain perception in distal extremities, neglect on right, 2+ reflexes in biceps, patella and achilles tendons. Finger to nose normal, heel to shin normal. When walking, drags his right foot.   Skin: Skin is warm and dry.  Nursing note and vitals reviewed.   ED Course  Procedures (including critical care time) CRITICAL CARE Performed by: Merrily Pew   Total critical care time: 32 minutes  Critical care time was exclusive of separately billable procedures and treating other patients.  Critical care was necessary to treat or prevent imminent or life-threatening deterioration.  Critical care was time spent personally by me on the following activities: development of treatment plan with patient and/or surrogate as well as nursing, discussions with consultants, evaluation of patient's response to treatment, examination of patient, obtaining history from patient or surrogate, ordering and performing treatments and interventions, ordering and review of laboratory studies, ordering and review of radiographic studies, pulse oximetry and re-evaluation of patient's condition.   Labs Review Labs Reviewed - No data to display  Imaging Review No results found. I have personally reviewed and evaluated these images and lab results as part of my medical decision-making.   EKG Interpretation None      MDM   Final diagnoses:  None   81 year old male presenting with right sided weakness for approximately 4 days but is also evident on physical examination. CT done showed large acute on  chronic subdural hematoma on the left which is consistent with his symptoms. Patient stable with normal vital signs. Discussed case with neurosurgery, Dr. Cyndy Freeze, on the phone who accepted the patient in [facility] for likely surgery on Monday.      Merrily Pew, MD 11/20/2014 2229

## 2014-12-06 NOTE — H&P (Signed)
CC: Right sided weakness Chief Complaint  Patient presents with  . Weakness    HPI: Jay Lester is a 81 y.o. male (right handed) who presents with two weeks of progressive subtle right hemiparesis.  It started in his leg and progressed to his arm.  No recent falls but he fell several months ago and sustained loss of consciousness.  No seizure disorders.  No mental status changes.  No cognitive impairment per family.  Not on blood thinners.  PMH: Past Medical History  Diagnosis Date  . Skin cancer   . Hypertension   . Coronary artery disease     Multivessel, LVEF 60%  . Hyperlipidemia   . Diverticulosis 2010  . Tubular adenoma 2010  . Redundant colon 2010    PSH: Past Surgical History  Procedure Laterality Date  . Coronary artery bypass graft  1994    LIMA to LAD, SVG to first and second diagonal, SVG to OM, SVG to PDA  . Colonoscopy  10/29/2008    Dr. Gala Romney- incomplete, redundant elongated colon precluded complete exam, suboptimal prep, diffuse diverticulosis, 2 tubular adenomas  . Colonoscopy  12/09/08    Virtual Colonoscopy-normal    SH: Social History  Substance Use Topics  . Smoking status: Former Smoker    Quit date: 04/18/1992  . Smokeless tobacco: None     Comment: Quit 19 yrs ago  . Alcohol Use: No    MEDS: Prior to Admission medications   Medication Sig Start Date End Date Taking? Authorizing Provider  albuterol (PROAIR HFA) 108 (90 BASE) MCG/ACT inhaler Inhale 2 puffs into the lungs every 6 (six) hours as needed. Shortness of breath   Yes Historical Provider, MD  alfuzosin (UROXATRAL) 10 MG 24 hr tablet Take 10 mg by mouth daily.   Yes Historical Provider, MD  amLODipine (NORVASC) 10 MG tablet TAKE 1 TABLET BY MOUTH ONCE DAILY. 07/16/14  Yes Satira Sark, MD  LORazepam (ATIVAN) 0.5 MG tablet Take 0.5 mg by mouth at bedtime as needed for anxiety or sleep.  09/17/12  Yes Historical Provider, MD  losartan (COZAAR) 100 MG tablet TAKE 1 TABLET BY MOUTH ONCE  DAILY. 11/18/14  Yes Satira Sark, MD  metoprolol succinate (TOPROL-XL) 25 MG 24 hr tablet TAKE 1/2 TABLET BY MOUTH ONCE DAILY. 07/16/14  Yes Satira Sark, MD  nitroGLYCERIN (NITROSTAT) 0.4 MG SL tablet Place 1 tablet (0.4 mg total) under the tongue every 5 (five) minutes as needed. 06/22/11  Yes Satira Sark, MD  Omega-3 Fatty Acids (FISH OIL BURP-LESS PO) Take 1 capsule by mouth every morning.   Yes Historical Provider, MD  simvastatin (ZOCOR) 10 MG tablet TAKE 1 TABLET BY MOUTH AT BEDTIME. 07/16/14  Yes Satira Sark, MD    ALLERGY: No Known Allergies  ROS: ROS  NEUROLOGIC EXAM: Awake, alert, oriented Memory and concentration grossly intact Speech fluent, appropriate CN grossly intact Motor exam: Upper Extremities Deltoid Bicep Tricep Grip  Right 5/5 5/5 5/5 5/5  Left 5/5 5/5 5/5 5/5   Lower Extremity IP Quad PF DF EHL  Right 5/5 5/5 5/5 5/5 5/5  Left 5/5 5/5 5/5 5/5 5/5  Subtle right pronator drift Sensation grossly intact to LT  IMGAING: CT Head: Large left frontal acute on chronic SDH with ~1cm midline shift.  IMPRESSION: - 81 y.o. male with symptomatic acute on chronic left frontal subdural hematoma.  Minimal neurological deficits.  PLAN: - I had a long discussion with the patient and his family members.  I recommend a left sided craniotomy for evacuation of this subdural hematoma.  I explained the risks including but not limited to seizures, bleeding, infection, re accumulation, and the imponderables in language that they understand.  They wish to proceed.  There is no urgency to this because he is awake and mentating well and has only minimal symptoms.  We will plan to do this Monday.

## 2014-12-07 LAB — ABO/RH: ABO/RH(D): O POS

## 2014-12-07 LAB — MRSA PCR SCREENING: MRSA by PCR: NEGATIVE

## 2014-12-07 LAB — TYPE AND SCREEN
ABO/RH(D): O POS
Antibody Screen: NEGATIVE

## 2014-12-07 NOTE — Progress Notes (Signed)
Utilization Review Completed.Red Mandt T8/21/2016  

## 2014-12-07 NOTE — Progress Notes (Signed)
No overnight events Awake, alert, and oriented MAE well except subtle right upper extremity weakness and pronator drift Surgery tomorrow

## 2014-12-08 ENCOUNTER — Inpatient Hospital Stay (HOSPITAL_COMMUNITY): Payer: Medicare Other | Admitting: Anesthesiology

## 2014-12-08 ENCOUNTER — Encounter (HOSPITAL_COMMUNITY): Payer: Self-pay | Admitting: Certified Registered"

## 2014-12-08 ENCOUNTER — Encounter (HOSPITAL_COMMUNITY): Admission: EM | Disposition: E | Payer: Self-pay | Source: Home / Self Care | Attending: Neurological Surgery

## 2014-12-08 HISTORY — PX: CRANIOTOMY: SHX93

## 2014-12-08 SURGERY — CRANIOTOMY HEMATOMA EVACUATION SUBDURAL
Anesthesia: General | Site: Head | Laterality: Left

## 2014-12-08 MED ORDER — LIDOCAINE HCL (CARDIAC) 20 MG/ML IV SOLN
INTRAVENOUS | Status: DC | PRN
  Administered 2014-12-08: 80 mg via INTRAVENOUS

## 2014-12-08 MED ORDER — LACTATED RINGERS IV SOLN: INTRAVENOUS | Status: DC | PRN

## 2014-12-08 MED ORDER — THROMBIN 20000 UNITS EX SOLR
CUTANEOUS | Status: DC | PRN
  Administered 2014-12-08: 20 mL via TOPICAL

## 2014-12-08 MED ORDER — PROPOFOL 10 MG/ML IV BOLUS
INTRAVENOUS | Status: AC
Start: 2014-12-08 — End: 2014-12-08
  Filled 2014-12-08: qty 20

## 2014-12-08 MED ORDER — THROMBIN 5000 UNITS EX SOLR
OROMUCOSAL | Status: DC | PRN
  Administered 2014-12-08: 16:00:00 via TOPICAL

## 2014-12-08 MED ORDER — SUFENTANIL CITRATE 50 MCG/ML IV SOLN
INTRAVENOUS | Status: AC
  Filled 2014-12-08: qty 1

## 2014-12-08 MED ORDER — SUFENTANIL CITRATE 50 MCG/ML IV SOLN
INTRAVENOUS | Status: DC | PRN
  Administered 2014-12-08: 10 ug via INTRAVENOUS

## 2014-12-08 MED ORDER — ROCURONIUM BROMIDE 50 MG/5ML IV SOLN
INTRAVENOUS | Status: AC
  Filled 2014-12-08: qty 1

## 2014-12-08 MED ORDER — BACITRACIN ZINC 500 UNIT/GM EX OINT
TOPICAL_OINTMENT | CUTANEOUS | Status: DC | PRN
  Administered 2014-12-08: 1 via TOPICAL

## 2014-12-08 MED ORDER — LIDOCAINE-EPINEPHRINE 1 %-1:100000 IJ SOLN
INTRAMUSCULAR | Status: DC | PRN
  Administered 2014-12-08: 6.5 mL

## 2014-12-08 MED ORDER — HEMOSTATIC AGENTS (NO CHARGE) OPTIME
TOPICAL | Status: DC | PRN
  Administered 2014-12-08: 1 via TOPICAL

## 2014-12-08 MED ORDER — PHENYLEPHRINE HCL 10 MG/ML IJ SOLN
10.0000 mg | INTRAVENOUS | Status: DC | PRN
  Administered 2014-12-08: 40 ug/min via INTRAVENOUS

## 2014-12-08 MED ORDER — STERILE WATER FOR INJECTION IJ SOLN: 2000.0000 mg | Freq: Three times a day (TID) | INTRAMUSCULAR | Status: DC

## 2014-12-08 MED ORDER — FENTANYL CITRATE (PF) 100 MCG/2ML IJ SOLN: 25.0000 ug | INTRAMUSCULAR | Status: DC | PRN

## 2014-12-08 MED ORDER — MIDAZOLAM HCL 2 MG/2ML IJ SOLN
INTRAMUSCULAR | Status: AC
  Filled 2014-12-08: qty 4

## 2014-12-08 MED ORDER — SODIUM CHLORIDE 0.9 % IV SOLN
INTRAVENOUS | Status: DC | PRN
  Administered 2014-12-08: 13:00:00 via INTRAVENOUS

## 2014-12-08 MED ORDER — LIDOCAINE HCL (CARDIAC) 20 MG/ML IV SOLN
INTRAVENOUS | Status: AC
  Filled 2014-12-08: qty 10

## 2014-12-08 MED ORDER — DEXAMETHASONE SODIUM PHOSPHATE 4 MG/ML IJ SOLN
INTRAMUSCULAR | Status: AC
  Filled 2014-12-08: qty 1

## 2014-12-08 MED ORDER — 0.9 % SODIUM CHLORIDE (POUR BTL) OPTIME
TOPICAL | Status: DC | PRN
  Administered 2014-12-08 (×4): 1000 mL

## 2014-12-08 MED ORDER — CEFTRIAXONE SODIUM 1 G IJ SOLR
1.0000 g | INTRAMUSCULAR | Status: AC
Start: 2014-12-08 — End: 2014-12-08
  Administered 2014-12-08: 1 g via INTRAVENOUS
  Filled 2014-12-08 (×2): qty 10

## 2014-12-08 MED ORDER — ONDANSETRON HCL 4 MG/2ML IJ SOLN
INTRAMUSCULAR | Status: AC
  Filled 2014-12-08: qty 2

## 2014-12-08 MED ORDER — CEFAZOLIN SODIUM-DEXTROSE 2-3 GM-% IV SOLR
2.0000 g | Freq: Three times a day (TID) | INTRAVENOUS | Status: AC
  Administered 2014-12-08 – 2014-12-09 (×2): 2 g via INTRAVENOUS
  Filled 2014-12-08 (×2): qty 50

## 2014-12-08 MED ORDER — SUGAMMADEX SODIUM 500 MG/5ML IV SOLN
INTRAVENOUS | Status: DC | PRN
  Administered 2014-12-08: 100 mg via INTRAVENOUS

## 2014-12-08 MED ORDER — DEXAMETHASONE SODIUM PHOSPHATE 4 MG/ML IJ SOLN
INTRAMUSCULAR | Status: DC | PRN
  Administered 2014-12-08: 4 mg via INTRAVENOUS

## 2014-12-08 MED ORDER — MICROFIBRILLAR COLL HEMOSTAT EX PADS
MEDICATED_PAD | CUTANEOUS | Status: DC | PRN
  Administered 2014-12-08: 1 via TOPICAL

## 2014-12-08 MED ORDER — SUGAMMADEX SODIUM 200 MG/2ML IV SOLN
INTRAVENOUS | Status: AC
  Filled 2014-12-08: qty 2

## 2014-12-08 MED ORDER — ROCURONIUM BROMIDE 100 MG/10ML IV SOLN
INTRAVENOUS | Status: DC | PRN
  Administered 2014-12-08: 50 mg via INTRAVENOUS

## 2014-12-08 MED ORDER — BUPIVACAINE-EPINEPHRINE (PF) 0.25% -1:200000 IJ SOLN
INTRAMUSCULAR | Status: AC
  Filled 2014-12-08: qty 30

## 2014-12-08 MED ORDER — SODIUM CHLORIDE 0.9 % IV SOLN
INTRAVENOUS | Status: DC | PRN
  Administered 2014-12-08 (×2): via INTRAVENOUS

## 2014-12-08 MED ORDER — SODIUM CHLORIDE 0.9 % IR SOLN
Status: DC | PRN
  Administered 2014-12-08: 500 mL

## 2014-12-08 MED ORDER — PROPOFOL 10 MG/ML IV BOLUS
INTRAVENOUS | Status: DC | PRN
  Administered 2014-12-08: 40 mg via INTRAVENOUS
  Administered 2014-12-08: 60 mg via INTRAVENOUS

## 2014-12-08 MED ORDER — ONDANSETRON HCL 4 MG/2ML IJ SOLN: 4.0000 mg | Freq: Once | INTRAMUSCULAR | Status: DC | PRN

## 2014-12-08 MED ORDER — BUPIVACAINE-EPINEPHRINE 0.5% -1:200000 IJ SOLN
INTRAMUSCULAR | Status: DC | PRN
  Administered 2014-12-08: 6.5 mL

## 2014-12-08 SURGICAL SUPPLY — 97 items
APL SKNCLS STERI-STRIP NONHPOA (GAUZE/BANDAGES/DRESSINGS)
APPLICATOR CHLORAPREP 3ML ORNG (MISCELLANEOUS) ×3 IMPLANT
BATTERY IQ STERILE (MISCELLANEOUS) ×2 IMPLANT
BENZOIN TINCTURE PRP APPL 2/3 (GAUZE/BANDAGES/DRESSINGS) IMPLANT
BILE BAG, REGULAR ×2 IMPLANT
BLADE CLIPPER SURG (BLADE) ×3 IMPLANT
BLADE SURG 11 STRL SS (BLADE) ×2 IMPLANT
BLADE ULTRA TIP 2M (BLADE) ×1 IMPLANT
BNDG GAUZE ELAST 4 BULKY (GAUZE/BANDAGES/DRESSINGS) IMPLANT
BRUSH SCRUB EZ 1% IODOPHOR (MISCELLANEOUS) ×1 IMPLANT
BTRY SRG DRVR 1.5 IQ (MISCELLANEOUS) ×1
BUR ACORN 6.0 PRECISION (BURR) ×2 IMPLANT
BUR ACORN 6.0MM PRECISION (BURR) ×1
BUR ADDG 1.1 (BURR) IMPLANT
BUR ADDG 1.1MM (BURR)
BUR MATCHSTICK NEURO 3.0 LAGG (BURR) IMPLANT
BUR SPIRAL ROUTER 2.3 (BUR) ×1 IMPLANT
BUR SPIRAL ROUTER 2.3MM (BUR) ×1
CANISTER SUCT 3000ML PPV (MISCELLANEOUS) ×3 IMPLANT
CATH ROBINSON RED A/P 14FR (CATHETERS) ×2 IMPLANT
CLIP TI MEDIUM 6 (CLIP) IMPLANT
DRAIN JACKSON PRATT 10MM FLAT (MISCELLANEOUS) ×2 IMPLANT
DRAIN SNY WOU 7FLT (WOUND CARE) IMPLANT
DRAPE NEUROLOGICAL W/INCISE (DRAPES) ×3 IMPLANT
DRAPE SURG 17X23 STRL (DRAPES) IMPLANT
DRAPE WARM FLUID 44X44 (DRAPE) ×3 IMPLANT
DRSG TELFA 3X8 NADH (GAUZE/BANDAGES/DRESSINGS) ×3 IMPLANT
ELECT CAUTERY BLADE 6.4 (BLADE) IMPLANT
ELECT REM PT RETURN 9FT ADLT (ELECTROSURGICAL) ×3
ELECTRODE REM PT RTRN 9FT ADLT (ELECTROSURGICAL) ×1 IMPLANT
EVACUATOR 1/8 PVC DRAIN (DRAIN) IMPLANT
EVACUATOR SILICONE 100CC (DRAIN) ×4 IMPLANT
GAUZE SPONGE 4X4 12PLY STRL (GAUZE/BANDAGES/DRESSINGS) ×1 IMPLANT
GAUZE SPONGE 4X4 16PLY XRAY LF (GAUZE/BANDAGES/DRESSINGS) IMPLANT
GLOVE BIO SURGEON STRL SZ8 (GLOVE) ×3 IMPLANT
GLOVE BIOGEL PI IND STRL 7.0 (GLOVE) ×1 IMPLANT
GLOVE BIOGEL PI IND STRL 7.5 (GLOVE) IMPLANT
GLOVE BIOGEL PI IND STRL 8 (GLOVE) IMPLANT
GLOVE BIOGEL PI INDICATOR 7.0 (GLOVE) ×2
GLOVE BIOGEL PI INDICATOR 7.5 (GLOVE) ×2
GLOVE BIOGEL PI INDICATOR 8 (GLOVE) ×2
GLOVE ECLIPSE 7.5 STRL STRAW (GLOVE) ×9 IMPLANT
GLOVE EXAM NITRILE LRG STRL (GLOVE) IMPLANT
GLOVE EXAM NITRILE MD LF STRL (GLOVE) IMPLANT
GLOVE EXAM NITRILE XL STR (GLOVE) IMPLANT
GLOVE EXAM NITRILE XS STR PU (GLOVE) IMPLANT
GLOVE INDICATOR 8.5 STRL (GLOVE) ×2 IMPLANT
GLOVE SS BIOGEL STRL SZ 7 (GLOVE) ×1 IMPLANT
GLOVE SUPERSENSE BIOGEL SZ 7 (GLOVE) ×2
GLOVE SURG SS PI 7.5 STRL IVOR (GLOVE) IMPLANT
GOWN STRL REUS W/ TWL LRG LVL3 (GOWN DISPOSABLE) ×2 IMPLANT
GOWN STRL REUS W/ TWL XL LVL3 (GOWN DISPOSABLE) IMPLANT
GOWN STRL REUS W/TWL 2XL LVL3 (GOWN DISPOSABLE) ×2 IMPLANT
GOWN STRL REUS W/TWL LRG LVL3 (GOWN DISPOSABLE) ×3
GOWN STRL REUS W/TWL XL LVL3 (GOWN DISPOSABLE)
HEMOSTAT POWDER KIT SURGIFOAM (HEMOSTASIS) ×1 IMPLANT
HEMOSTAT POWDER SURGIFOAM 1G (HEMOSTASIS) ×2 IMPLANT
HEMOSTAT SURGICEL 2X14 (HEMOSTASIS) ×2 IMPLANT
KIT BASIN OR (CUSTOM PROCEDURE TRAY) ×3 IMPLANT
KIT ROOM TURNOVER OR (KITS) ×3 IMPLANT
NDL HYPO 25X1 1.5 SAFETY (NEEDLE) ×1 IMPLANT
NEEDLE HYPO 25X1 1.5 SAFETY (NEEDLE) ×3 IMPLANT
NS IRRIG 1000ML POUR BTL (IV SOLUTION) ×6 IMPLANT
PACK CRANIOTOMY (CUSTOM PROCEDURE TRAY) ×3 IMPLANT
PATTIES SURGICAL .5 X.5 (GAUZE/BANDAGES/DRESSINGS) IMPLANT
PATTIES SURGICAL .5 X3 (DISPOSABLE) IMPLANT
PATTIES SURGICAL 1X1 (DISPOSABLE) IMPLANT
PLATE 1.5  2HOLE LNG NEURO (Plate) ×4 IMPLANT
PLATE 1.5 2HOLE LNG NEURO (Plate) IMPLANT
PLATE 1.5/0.5 22MM BURR HO (Plate) ×2 IMPLANT
SCREW SELF DRILL HT 1.5/4MM (Screw) ×16 IMPLANT
SPONGE NEURO XRAY DETECT 1X3 (DISPOSABLE) IMPLANT
SPONGE SURGIFOAM ABS GEL 100 (HEMOSTASIS) ×1 IMPLANT
SPONGE SURGIFOAM ABS GEL 100C (HEMOSTASIS) ×5 IMPLANT
STAPLER VISISTAT 35W (STAPLE) ×5 IMPLANT
STOCKINETTE 6  STRL (DRAPES)
STOCKINETTE 6 STRL (DRAPES) ×1 IMPLANT
SUT ETHILON 3 0 FSL (SUTURE) IMPLANT
SUT ETHILON 3 0 PS 1 (SUTURE) IMPLANT
SUT NURALON 4 0 TR CR/8 (SUTURE) ×5 IMPLANT
SUT PL GUT 3 0 FS 1 (SUTURE) IMPLANT
SUT SILK 0 TIES 10X30 (SUTURE) ×3 IMPLANT
SUT SILK 2 0 FS (SUTURE) ×2 IMPLANT
SUT STEEL 0 (SUTURE)
SUT STEEL 0 18XMFL TIE 17 (SUTURE) IMPLANT
SUT VIC AB 0 CT1 18XCR BRD8 (SUTURE) ×1 IMPLANT
SUT VIC AB 0 CT1 8-18 (SUTURE) ×3
SUT VIC AB 2-0 CT1 18 (SUTURE) ×8 IMPLANT
SUT VIC AB 3-0 SH 8-18 (SUTURE) ×4 IMPLANT
SYR CONTROL 10ML LL (SYRINGE) ×2 IMPLANT
TOWEL OR 17X24 6PK STRL BLUE (TOWEL DISPOSABLE) ×3 IMPLANT
TOWEL OR 17X26 10 PK STRL BLUE (TOWEL DISPOSABLE) ×3 IMPLANT
TRAY FOLEY W/METER SILVER 14FR (SET/KITS/TRAYS/PACK) ×1 IMPLANT
TUBE CONNECTING 12'X1/4 (SUCTIONS)
TUBE CONNECTING 12X1/4 (SUCTIONS) IMPLANT
UNDERPAD 30X30 INCONTINENT (UNDERPADS AND DIAPERS) IMPLANT
WATER STERILE IRR 1000ML POUR (IV SOLUTION) ×3 IMPLANT

## 2014-12-08 NOTE — Progress Notes (Addendum)
~  2020: Found pt in room, A-line pulled out as well as IV. Pt states "It didn't belong there". Pt A/O x4. Pressure held to A-line site. Pt continues to attempt to take arm bands off, 2nd IV, monitor equipment as well as touching his head near drain. Pt cleaned, bed changed, Dr. Cyndy Freeze on phone checking on pt. Informed MD of pt's behavior. Order for Bilateral wrist restraints. Order for [facility] f/u in AM. Discussed course of care if drain becomes dislodged. Will continue to monitor closely. Bed alarm on.

## 2014-12-08 NOTE — Anesthesia Preprocedure Evaluation (Addendum)
Anesthesia Evaluation  Patient identified by MRN, date of birth, ID band Patient awake    Reviewed: Allergy & Precautions, H&P , NPO status , Patient's Chart, lab work & pertinent test results  History of Anesthesia Complications Negative for: history of anesthetic complications  Airway Mallampati: I  TM Distance: >3 FB Neck ROM: full    Dental no notable dental hx. (+) Edentulous Lower, Edentulous Upper   Pulmonary neg pulmonary ROS, former smoker,  breath sounds clear to auscultation  Pulmonary exam normal       Cardiovascular hypertension, Pt. on medications + CAD and + CABG (1994) Normal cardiovascular examRhythm:regular Rate:Normal     Neuro/Psych    GI/Hepatic negative GI ROS, Neg liver ROS,   Endo/Other  negative endocrine ROS  Renal/GU negative Renal ROS     Musculoskeletal   Abdominal   Peds  Hematology negative hematology ROS (+)   Anesthesia Other Findings 10/2010 stress with no reversible defects, EF normal, basal inferior hypokinesis, moderate middle and apical septal hypokinesis Regularly sees cardiology, last seen 10/2014 (last month), no changes    Reproductive/Obstetrics negative OB ROS                          Anesthesia Physical Anesthesia Plan  ASA: III  Anesthesia Plan: General   Post-op Pain Management:    Induction: Intravenous  Airway Management Planned: Oral ETT  Additional Equipment: Arterial line  Intra-op Plan:   Post-operative Plan:   Informed Consent: I have reviewed the patients History and Physical, chart, labs and discussed the procedure including the risks, benefits and alternatives for the proposed anesthesia with the patient or authorized representative who has indicated his/her understanding and acceptance.     Plan Discussed with: Anesthesiologist, CRNA and Surgeon  Anesthesia Plan Comments: (Presents with chronic subdural hematoma for  craniotomy and evacuation H/H 12/36.Marland Kitchen PLT >100, coags with slightly elevated PT, normal INR)     Anesthesia Quick Evaluation

## 2014-12-08 NOTE — Op Note (Addendum)
Pre-operative diagnosis: Left-sided acute on chronic subdural hematoma  Postoperative diagnosis: Same  Procedure performed: Left-sided craniotomy for evacuation of subdural hematoma  Surgeon: Cyndy Freeze, MD  Assistant: Kary Kos, MD  Anesthesia: Gen. endotracheal  Estimated blood loss:100 cc  Drains:Subdural  Indication: Jay Lester is an 81 year old male with a symptomatic acute on chronic left frontal subdural hematoma.  A long discussion was had with he and his family when he was admitted.  I recommended a craniotomy and we discussed the risks and benefits.  They understand and wish to proceed.  Procedure: After smooth induction of general anesthesia the [place] head holder was applied and then fixated to the table. The area for surgery was clipped of hair and skin prep was performed.   A linear incision was made just above the superior temporal line on the left and dissection was taken to the level of the skull. Two burr holes were made and then an oval craniotomy was performed. The dura was opened in a cruciate fashion.  The subdural hematoma was evacuated with suction and irrigation. A membrane on the cortical surface was elevated and then maximally resected..  Meticulous hemostasis was achieved and a subdural drain was placed. The dura was closed with interrupted sutures. The bone flap was secured to the skull with titanium plates and screws and two central tack up sutures were tied to secure the dura against the bone flap.  The galea was closed with interrupted vicryl sutures. The skin was closed with staples.  He was extubated uneventfully and transferred to the PACU.

## 2014-12-08 NOTE — Transfer of Care (Signed)
Immediate Anesthesia Transfer of Care Note  Patient: Jay Lester  Procedure(s) Performed: Procedure(s) with comments: CRANIOTOMY HEMATOMA EVACUATION SUBDURAL (Left) - left   Patient Location: PACU  Anesthesia Type:General  Level of Consciousness: awake and oriented  Airway & Oxygen Therapy: Patient Spontanous Breathing and Patient connected to nasal cannula oxygen  Post-op Assessment: Report given to RN, Post -op Vital signs reviewed and stable and Patient moving all extremities  Post vital signs: Reviewed and stable  Last Vitals:  Filed Vitals:   12/07/2014 0700  BP: 138/70  Pulse: 76  Temp: 36.8 C  Resp: 17    Complications: No apparent anesthesia complications

## 2014-12-08 NOTE — Anesthesia Procedure Notes (Signed)
Procedure Name: Intubation Date/Time: 11/30/2014 2:30 PM Performed by: Melina Copa, Shatarra Wehling R Pre-anesthesia Checklist: Patient identified, Emergency Drugs available, Suction available, Patient being monitored and Timeout performed Patient Re-evaluated:Patient Re-evaluated prior to inductionOxygen Delivery Method: Circle system utilized Preoxygenation: Pre-oxygenation with 100% oxygen Intubation Type: IV induction Ventilation: Mask ventilation without difficulty Laryngoscope Size: Mac and 4 Grade View: Grade I Tube type: Oral Tube size: 7.5 mm Number of attempts: 1 Airway Equipment and Method: Stylet Placement Confirmation: ETT inserted through vocal cords under direct vision,  positive ETCO2 and breath sounds checked- equal and bilateral Secured at: 22 cm Tube secured with: Tape Dental Injury: Teeth and Oropharynx as per pre-operative assessment

## 2014-12-08 NOTE — Progress Notes (Signed)
No acute overnight events Awake and alert Subtle right hemiparesis and pronator drift No new imaging Ready for surgery, plan left frontal craniotomy for evacuation of hematoma.

## 2014-12-09 ENCOUNTER — Encounter (HOSPITAL_COMMUNITY): Payer: Self-pay | Admitting: Neurological Surgery

## 2014-12-09 ENCOUNTER — Inpatient Hospital Stay (HOSPITAL_COMMUNITY): Payer: Medicare Other

## 2014-12-09 DIAGNOSIS — S065X1S Traumatic subdural hemorrhage with loss of consciousness of 30 minutes or less, sequela: Secondary | ICD-10-CM

## 2014-12-09 NOTE — Consult Note (Signed)
Physical Medicine and Rehabilitation Consult Reason for Consult: Traumatic Left-sided acute on chronic subdural hematoma Referring Physician: Dr. Cyndy Freeze   HPI: Jay Lester is a 81 y.o. right handed male with history of hypertension, coronary artery disease with CABG 22 years ago. Patient lives with his 77 year old wife. Presented 12/01/2014 with progressive right hemiparesis, memory loss 2 weeks no recent falls but he did report several falls a few months ago . Cranial CT scan showed a large left frontal acute on chronic subdural hematoma with 1 cm midline shift. Underwent left-sided craniotomy for evacuation of subdural hematoma 12/09/2014 per Dr. Cyndy Freeze. Maintained on Keppra for seizure prophylaxis. Hospital course restlessness and agitation. Maintain on a regular consistency diet. Physical and occupational therapy evaluations are pending. M.D. has requested physical medicine rehabilitation consult.   Review of Systems  Constitutional: Negative for fever and chills.  HENT: Negative for hearing loss.   Eyes: Negative for blurred vision and double vision.  Respiratory: Negative for cough and shortness of breath.   Cardiovascular: Positive for palpitations. Negative for chest pain.  Gastrointestinal: Positive for constipation. Negative for nausea, vomiting and abdominal pain.  Genitourinary: Positive for urgency and frequency. Negative for hematuria.  Musculoskeletal: Positive for myalgias, joint pain and falls.  Neurological: Positive for dizziness, focal weakness and headaches.  Endo/Heme/Allergies: Does not bruise/bleed easily.   Past Medical History  Diagnosis Date  . Skin cancer   . Hypertension   . Coronary artery disease     Multivessel, LVEF 60%  . Hyperlipidemia   . Diverticulosis 2010  . Tubular adenoma 2010  . Redundant colon 2010   Past Surgical History  Procedure Laterality Date  . Coronary artery bypass graft  1994    LIMA to LAD, SVG to first and second  diagonal, SVG to OM, SVG to PDA  . Colonoscopy  10/29/2008    Dr. Gala Romney- incomplete, redundant elongated colon precluded complete exam, suboptimal prep, diffuse diverticulosis, 2 tubular adenomas  . Colonoscopy  12/09/08    Virtual Colonoscopy-normal   Family History  Problem Relation Age of Onset  . Colon cancer  51  . Colon cancer  50   Social History:  reports that he quit smoking about 22 years ago. He does not have any smokeless tobacco history on file. He reports that he does not drink alcohol or use illicit drugs. Allergies: No Known Allergies Medications Prior to Admission  Medication Sig Dispense Refill  . albuterol (PROAIR HFA) 108 (90 BASE) MCG/ACT inhaler Inhale 2 puffs into the lungs every 6 (six) hours as needed. Shortness of breath    . alfuzosin (UROXATRAL) 10 MG 24 hr tablet Take 10 mg by mouth daily.    Marland Kitchen amLODipine (NORVASC) 10 MG tablet TAKE 1 TABLET BY MOUTH ONCE DAILY. 30 tablet 11  . LORazepam (ATIVAN) 0.5 MG tablet Take 0.5 mg by mouth at bedtime as needed for anxiety or sleep.     Marland Kitchen losartan (COZAAR) 100 MG tablet TAKE 1 TABLET BY MOUTH ONCE DAILY. 30 tablet 3  . metoprolol succinate (TOPROL-XL) 25 MG 24 hr tablet TAKE 1/2 TABLET BY MOUTH ONCE DAILY. 15 tablet 11  . nitroGLYCERIN (NITROSTAT) 0.4 MG SL tablet Place 1 tablet (0.4 mg total) under the tongue every 5 (five) minutes as needed. 100 tablet 3  . Omega-3 Fatty Acids (FISH OIL BURP-LESS PO) Take 1 capsule by mouth every morning.    . simvastatin (ZOCOR) 10 MG tablet TAKE 1 TABLET BY MOUTH AT BEDTIME. 30 tablet  11    Home: Home Living Family/patient expects to be discharged to:: Private residence Living Arrangements: Spouse/significant other  Functional History:   Functional Status:  Mobility:          ADL:    Cognition: Cognition Orientation Level: Oriented X4    Blood pressure 137/66, pulse 84, temperature 98 F (36.7 C), temperature source Oral, resp. rate 22, height 5\' 7"  (1.702 m),  weight 64.3 kg (141 lb 12.1 oz), SpO2 96 %. Physical Exam  Constitutional: He appears well-developed.  HENT:  Craniotomy site dressed without drainage  Eyes: EOM are normal.  No nystagmus  Neck: Normal range of motion. Neck supple. No thyromegaly present.  Cardiovascular: Normal rate and regular rhythm.   Respiratory: Effort normal and breath sounds normal. No respiratory distress.  GI: Soft. Bowel sounds are normal. He exhibits no distension.  Neurological: He is alert.  Patient is oriented to person, place date of birth. Follows simple commands. Fair awareness of deficits. Strength 4+ RUE and 5/5 LUE. No pronator drift. LE's:  3/5 prox, 4- KE and ADF/APF. No gross sensory deficits.   Psychiatric: He has a normal mood and affect. His behavior is normal.    No results found for this or any previous visit (from the past 24 hour(s)). Ct Head Wo Contrast  12/09/2014   CLINICAL DATA:  Followup subdural hematoma  EXAM: CT HEAD WITHOUT CONTRAST  TECHNIQUE: Contiguous axial images were obtained from the base of the skull through the vertex without intravenous contrast.  COMPARISON:  12/01/2014  FINDINGS: There is been interval craniotomy on the left with placement of a surgical drain. The majority of the previously seen subdural hematoma has been evacuated. Dense material is noted in the surgical bed which may be related hemostatic products. Air is also noted within the operative bed. The degree of midline shift is significantly decreased now measuring approximately 3 mm. The left lateral ventricle effacement has decreased significantly in the interval from the prior exam. Diffuse basal ganglia calcifications are seen. No findings to suggest acute head ridge or acute infarction are noted. Diffuse atrophic changes are again noted.  IMPRESSION: Status post craniotomy with drain placement for a left subdural hematoma. The mass effect caused by the subdural hematoma has decreased significantly with  evacuation. Diffuse dense material is noted likely related to hemostatic products. Correlation with the appearance of drainage fluid and surgical procedure is recommended.   Electronically Signed   By: Inez Catalina M.D.   On: 12/09/2014 07:59    Assessment/Plan: Diagnosis: left acute/chronic SDH which required craniotromy 1. Does the need for close, 24 hr/day medical supervision in concert with the patient's rehab needs make it unreasonable for this patient to be served in a less intensive setting? Yes and Potentially 2. Co-Morbidities requiring supervision/potential complications: htn, CAD 3. Due to bladder management, bowel management, safety, skin/wound care, disease management, medication administration, pain management and patient education, does the patient require 24 hr/day rehab nursing? Yes 4. Does the patient require coordinated care of a physician, rehab nurse, PT (1-2 hrs/day, 5 days/week), OT (1-2 hrs/day, 5 days/week) and SLP (potentially 1-2 hrs/day, potentially 1-2 days/week) to address physical and functional deficits in the context of the above medical diagnosis(es)? Yes Addressing deficits in the following areas: balance, endurance, locomotion, strength, transferring, bowel/bladder control, bathing, dressing, feeding, grooming, toileting, cognition and psychosocial support 5. Can the patient actively participate in an intensive therapy program of at least 3 hrs of therapy per day at least 5 days  per week? Yes and Potentially 6. The potential for patient to make measurable gains while on inpatient rehab is excellent 7. Anticipated functional outcomes upon discharge from inpatient rehab are modified independent and supervision  with PT, modified independent and supervision with OT, modified independent and supervision with SLP. 8. Estimated rehab length of stay to reach the above functional goals is: potentially 7-9 days 9. Does the patient have adequate social supports and living  environment to accommodate these discharge functional goals? Yes and Potentially 10. Anticipated D/C setting: Home 11. Anticipated post D/C treatments: HH therapy and Outpatient therapy 12. Overall Rehab/Functional Prognosis: excellent  RECOMMENDATIONS: This patient's condition is appropriate for continued rehabilitative care in the following setting: see below Patient has agreed to participate in recommended program. Potentially Note that insurance prior authorization may be required for reimbursement for recommended care.  Comment: Would like to see how Mr. Coury performs with therapy. He is doing remarkably well considering injury. Will follow along for functional and medical progress.   Meredith Staggers, MD, [facility] 12/09/2014     12/09/2014

## 2014-12-09 NOTE — Evaluation (Addendum)
Physical Therapy Evaluation Patient Details Name: Jay Lester MRN: [id number] DOB: [birth year] Today's Date: 12/09/2014   History of Present Illness  81 year old male with a symptomatic acute on chronic left frontal subdural hematoma; s/p evacuation of hematoma.  Clinical Impression  Patient demonstrates deficits in functional mobility as indicated below. Will benefit from continued skilled PT to address deficits and maximize function. Will see as indicated and progress as tolerated. During assessment patient with some noted instability during ambulation (noted LOB) in addition to decreased safety awareness. Recommend further rehabilitation to address mobility and balance deficits and decrease risk of falls.    Follow Up Recommendations CIR;Supervision/Assistance - 24 hour    Equipment Recommendations  Rolling walker with 5" wheels    Recommendations for Other Services Rehab consult     Precautions / Restrictions Precautions Precautions: Fall Restrictions Weight Bearing Restrictions: No      Mobility  Bed Mobility               General bed mobility comments: received on toilet with nsg  Transfers Overall transfer level: Needs assistance Equipment used: Rolling walker (2 wheeled) Transfers: Sit to/from Stand Sit to Stand: Min assist         General transfer comment: Min assist to elevate to standing, assist for stability, difficulty maintaining upright. Cues for safety  Ambulation/Gait Ambulation/Gait assistance: Min assist Ambulation Distance (Feet): 90 Feet (20 ft with RW) Assistive device: Rolling walker (2 wheeled);1 person hand held assist Gait Pattern/deviations: Step-through pattern;Decreased stride length;Ataxic;Staggering left;Staggering right;Drifts right/left;Trunk flexed;Narrow base of support Gait velocity: decreased Gait velocity interpretation: Below normal speed for age/gender General Gait Details: Patient with instability noted during ambulation,  difficulty with coordination of gait, RLE with some modest deficits in coordination. Patient required physical assist to maintain stability with some staggering left/right. one episode of moderate assist for LOB during turns.  Stairs            Wheelchair Mobility    Modified Rankin (Stroke Patients Only) Modified Rankin (Stroke Patients Only) Pre-Morbid Rankin Score: No symptoms Modified Rankin: Moderately severe disability     Balance Overall balance assessment: History of Falls                                           Pertinent Vitals/Pain Pain Assessment: Faces Faces Pain Scale: Hurts little more Pain Location: head Pain Descriptors / Indicators: Headache Pain Intervention(s): Monitored during session;Repositioned    Home Living Family/patient expects to be discharged to:: Private residence Living Arrangements: Spouse/significant other Available Help at Discharge: Family (per nsg, wife has difficulty walking due to endurance) Type of Home: House Home Access: Stairs to enter Entrance Stairs-Rails: Psychiatric nurse of Steps: 5 Home Layout: One level Home Equipment: Cane - single point      Prior Function Level of Independence: Independent               Hand Dominance   Dominant Hand: Right    Extremity/Trunk Assessment   Upper Extremity Assessment: Overall WFL for tasks assessed           Lower Extremity Assessment: Generalized weakness, RLE with modest coordination deficits during functional tasks.         Communication   Communication: HOH  Cognition Arousal/Alertness: Awake/alert Behavior During Therapy: WFL for tasks assessed/performed Overall Cognitive Status: No family/caregiver present to determine baseline cognitive functioning  General Comments      Exercises        Assessment/Plan    PT Assessment Patient needs continued PT services  PT Diagnosis  Difficulty walking;Abnormality of gait;Acute pain   PT Problem List Decreased strength;Decreased activity tolerance;Decreased balance;Decreased mobility;Decreased coordination;Decreased cognition;Decreased safety awareness;Pain  PT Treatment Interventions DME instruction;Gait training;Stair training;Functional mobility training;Therapeutic activities;Therapeutic exercise;Patient/family education   PT Goals (Current goals can be found in the Care Plan section) Acute Rehab PT Goals Patient Stated Goal: to go home for his wifes birthday PT Goal Formulation: With patient Time For Goal Achievement: 12/23/14 Potential to Achieve Goals: Good    Frequency Min 3X/week   Barriers to discharge Decreased caregiver support      Co-evaluation               End of Session Equipment Utilized During Treatment: Gait belt Activity Tolerance: Patient tolerated treatment well Patient left: in chair;with call bell/phone within reach;with chair alarm set Nurse Communication: Mobility status         Time: 1435-1453 PT Time Calculation (min) (ACUTE ONLY): 18 min   Charges:   PT Evaluation $Initial PT Evaluation Tier I: 1 Procedure     PT G CodesDuncan Lester 01-04-2015, 3:43 PM Jay Lester, [place] DPT  [phone]

## 2014-12-09 NOTE — Progress Notes (Addendum)
Some delirium last night but that has since resolved AVSS Awake, alert, oriented Moving all extremities with full strength, drift resolved Bandage c/d/i, drain in place CT Head: Subdural evacuated, midline shift mostly resolved, small amount of epidural fluid remains Marked improvement in neurological function Transfer to step down PT/OT Rehab eval Drain d/c'd

## 2014-12-09 NOTE — Care Management Important Message (Signed)
Important Message  Patient Details  Name: Boss Danielsen MRN: [id number] Date of Birth: Aug 21, 1923   Medicare Important Message Given:  Yes-second notification given    Ella Bodo, RN 12/09/2014, 3:49 PM

## 2014-12-09 NOTE — Plan of Care (Signed)
Problem: Consults Goal: Diagnosis - Craniotomy Outcome: Completed/Met Date Met:  12/09/14 Subdural hematoma

## 2014-12-09 NOTE — Progress Notes (Signed)
Inpatient Rehabilitation  We have received a screen request for possible rehab admission.  Dr. Naaman Plummer has completed the consult and the admissions coordinator will follow up [place].  Please call if questions.  [place] Admissions Coordinator Cell [phone] Office [phone]

## 2014-12-10 ENCOUNTER — Inpatient Hospital Stay (HOSPITAL_COMMUNITY): Payer: Medicare Other

## 2014-12-10 ENCOUNTER — Encounter (HOSPITAL_COMMUNITY): Payer: Self-pay

## 2014-12-10 DIAGNOSIS — R4701 Aphasia: Secondary | ICD-10-CM

## 2014-12-10 LAB — CBC
HCT: 32 % — ABNORMAL LOW (ref 39.0–52.0)
HEMOGLOBIN: 11 g/dL — AB (ref 13.0–17.0)
MCH: 30.8 pg (ref 26.0–34.0)
MCHC: 34.4 g/dL (ref 30.0–36.0)
MCV: 89.6 fL (ref 78.0–100.0)
Platelets: 111 10*3/uL — ABNORMAL LOW (ref 150–400)
RBC: 3.57 MIL/uL — ABNORMAL LOW (ref 4.22–5.81)
RDW: 14.2 % (ref 11.5–15.5)
WBC: 13.4 10*3/uL — ABNORMAL HIGH (ref 4.0–10.5)

## 2014-12-10 LAB — URINALYSIS, ROUTINE W REFLEX MICROSCOPIC
Bilirubin Urine: NEGATIVE
GLUCOSE, UA: NEGATIVE mg/dL
KETONES UR: 15 mg/dL — AB
LEUKOCYTES UA: NEGATIVE
Nitrite: NEGATIVE
PH: 5.5 (ref 5.0–8.0)
Protein, ur: NEGATIVE mg/dL
Specific Gravity, Urine: 1.019 (ref 1.005–1.030)
Urobilinogen, UA: 0.2 mg/dL (ref 0.0–1.0)

## 2014-12-10 LAB — URINE MICROSCOPIC-ADD ON

## 2014-12-10 LAB — BASIC METABOLIC PANEL
ANION GAP: 6 (ref 5–15)
BUN: 10 mg/dL (ref 6–20)
CHLORIDE: 102 mmol/L (ref 101–111)
CO2: 27 mmol/L (ref 22–32)
Calcium: 8.5 mg/dL — ABNORMAL LOW (ref 8.9–10.3)
Creatinine, Ser: 1.02 mg/dL (ref 0.61–1.24)
GFR calc Af Amer: >60 mL/min (ref >60)
GLUCOSE: 109 mg/dL — AB (ref 65–99)
POTASSIUM: 3.7 mmol/L (ref 3.5–5.1)
SODIUM: 135 mmol/L (ref 135–145)

## 2014-12-10 LAB — SODIUM: SODIUM: 136 mmol/L (ref 135–145)

## 2014-12-10 MED ORDER — LEVETIRACETAM 500 MG PO TABS
1000.0000 mg | ORAL_TABLET | Freq: Two times a day (BID) | ORAL | Status: DC
  Administered 2014-12-10: 1000 mg via ORAL
  Filled 2014-12-10: qty 2

## 2014-12-10 MED ORDER — PHENYTOIN SODIUM 50 MG/ML IJ SOLN
100.0000 mg | Freq: Three times a day (TID) | INTRAMUSCULAR | Status: DC
Start: 2014-12-10 — End: 2014-12-20
  Administered 2014-12-10 – 2014-12-19 (×27): 100 mg via INTRAVENOUS
  Filled 2014-12-10 (×36): qty 2

## 2014-12-10 MED ORDER — SODIUM CHLORIDE 0.9 % IV SOLN
20.0000 mg/kg | Freq: Once | INTRAVENOUS | Status: AC
  Administered 2014-12-10: 1286 mg via INTRAVENOUS
  Filled 2014-12-10: qty 25.72

## 2014-12-10 MED ORDER — SODIUM CHLORIDE 0.9 % IV SOLN
1000.0000 mg | Freq: Two times a day (BID) | INTRAVENOUS | Status: DC
  Administered 2014-12-10 – 2014-12-19 (×19): 1000 mg via INTRAVENOUS
  Filled 2014-12-10 (×23): qty 10

## 2014-12-10 MED ORDER — SODIUM CHLORIDE 0.9 % IV SOLN
500.0000 mg | INTRAVENOUS | Status: AC
  Administered 2014-12-10: 500 mg via INTRAVENOUS
  Filled 2014-12-10: qty 5

## 2014-12-10 NOTE — Progress Notes (Signed)
Inpatient Rehabilitation  I met with the patient's wife and 2 daughters in pt's room while he was away for MRI.  I discussed with them pt's likely need for post acute rehab services and potential venues.  I talked to them about CIR when pt. is medically ready and stable.  They believe pt. would be agreeable to IP Rehab but not to SNF.  I left informational booklets for family to review and informed them that I will return at a later time today to meet pt.   I will proceed with insurance authorization process in preparation for possible IP Rehab admission pending pt's medical stability and bed availability.  Please call if questions.  [place] Admissions Coordinator Cell [phone] Office [phone]

## 2014-12-10 NOTE — Progress Notes (Signed)
EEG without ongoing seizure, MRI negative for stroke.   Will continue to monitor for intermittent subclinical seizures.   Roland Rack, MD [facility] [phone]  If 7pm- 7am, please page neurology on call as listed in [place].

## 2014-12-10 NOTE — Progress Notes (Signed)
SLP Cancellation Note  Patient Details Name: Jay Lester MRN: [id number] DOB: Dec 15, 1923   Cancelled treatment:       Reason Eval/Treat Not Completed: Patient at procedure or test/unavailable (EEG)   [place], [place] 12/10/2014, 1:59 PM

## 2014-12-10 NOTE — Progress Notes (Addendum)
Last spoke to patient at 530 and was neuro intact. At [place] went in to address a bed alarm ringing, and patient's speech was very dysarthric and patient was unable to speak name and speech was unintelligible. Speech deficits then became intermittent.  Dr. Kathyrn Sheriff notified, stat CT performed. Reported changes to oncoming nurse to monitor.

## 2014-12-10 NOTE — Progress Notes (Signed)
PT Cancellation Note  Patient Details Name: Jay Lester MRN: [id number] DOB: 11-Feb-1924   Cancelled Treatment:    Reason Eval/Treat Not Completed: Medical issues which prohibited therapy.  RN asks to hold due to on EEG.  12/10/2014  [name], PT [phone]  (pager)   [name], [name] 12/10/2014, 3:01 PM

## 2014-12-10 NOTE — Progress Notes (Signed)
Intermittent expressive aphasia overnight, repeated CT scan which is stable AVSS Awake, alert Slurred speech 5/5 throughout, no drift Incision c/d/i CT Head: Unchanged from prior Hemiparesis resolved.  I suspect he's having seizures.  I gave him a 500mg  dose of Keppra IV and am increasing his scheduled dose to 1000mg  BID. I spoke with Dr. Leonel Ramsay, the neurohospitalist, and he will see him and set up long term EEG. I will keep him in the ICU for now. Check UA and sodium.

## 2014-12-10 NOTE — Progress Notes (Signed)
LTVM  EEG hooked up and running

## 2014-12-10 NOTE — Consult Note (Signed)
Neurology Consultation Reason for Consult:  Difficulty speaking Referring Physician: [place], B  CC:  Difficulty speaking  History is obtained from: patient  HPI: Jay Lester is a 81 y.o. male  With a history of recent SDH evacuation who had good response to his evacuation with resolution of the hemiparesis.  This morning , he began having waxing and waning aphasia/dysarthria in drooling which has been static deficit since around 8:30 a.m.   repeat CT was performed which did not show any acute worsening   LKW:  5:30 AM tpa given?: no,  Recent  craniectomy    ROS: A 14 point ROS was performed and is negative except as noted in the HPI.   Past Medical History  Diagnosis Date  . Skin cancer   . Hypertension   . Coronary artery disease     Multivessel, LVEF 60%  . Hyperlipidemia   . Diverticulosis 2010  . Tubular adenoma 2010  . Redundant colon 2010     Family History  Problem Relation Age of Onset  . Colon cancer  56  . Colon cancer  68     Social History:  reports that he quit smoking about 22 years ago. He does not have any smokeless tobacco history on file. He reports that he does not drink alcohol or use illicit drugs.   Exam: Current vital signs: BP 154/84 mmHg  Pulse 88  Temp(Src) 97.4 F (36.3 C) (Oral)  Resp 20  Ht 5\' 7"  (1.702 m)  Wt 64.3 kg (141 lb 12.1 oz)  BMI 22.20 kg/m2  SpO2 95% Vital signs in last 24 hours: Temp:  [97.4 F (36.3 C)-99.2 F (37.3 C)] 97.4 F (36.3 C) (08/24 0757) Pulse Rate:  [69-176] 88 (08/24 0700) Resp:  [13-33] 20 (08/24 0700) BP: (107-154)/(49-111) 154/84 mmHg (08/24 0700) SpO2:  [92 %-98 %] 95 % (08/24 0700)   Physical Exam  Constitutional: Appears well-developed and well-nourished.  Psych: Affect appropriate to situation Eyes: No scleral injection HENT: No OP obstrucion Head:  Craniotomy scar on the left Cardiovascular: Normal rate and regular rhythm.  Respiratory: Effort normal  GI: Soft.  No distension.  There is no tenderness.  Skin: WDI  Neuro: Mental Status: Patient is awake, alert,  He does have some degree of aphasia ,  complicated by a severe dysarthria as well. He is able to follow multistep commands , but is unable to repeat. He does have some verbal output that makes sense, but most of it is unintelligible. Cranial Nerves: II: Visual Fields are full. Pupils are equal, round, and reactive to light.   III,IV, VI: EOMI without ptosis or diploplia.  V: Facial sensation is symmetric to temperature VII: Facial movement is  Notable for a mild right lag on smile as well as mildly decreased nasolabial fold VIII: hearing is intact to voice X: Uvula elevates symmetrically XI: Shoulder shrug is symmetric. XII: tongue is midline without atrophy or fasciculations.  Motor: Tone is normal. Bulk is normal. 5/5 strength was present in all four extremities.  Sensory: Sensation is symmetric to light touch and temperature in the arms and legs. Cerebellar: FNF  intact bilaterally     I have reviewed labs in epic and the results pertinent to this consultation are:  [id number]  I have reviewed the images obtained:  He had a head CT which shows stable subdural hematoma across left hemisphere  Impression:  81 year old gentleman with a history of subdural hematoma who presents with new onset aphasia with right facial  weakness. Possibilities would include a small seizure focus vs ischemic infarct. He is being loaded with additional Keppra currently , and we will obtain a stat MRI. If this does not show an admission for his aphasia then we'll proceed with prolonged EEG.  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 Given that he is still alert and communicative , I would not favor even if this does represent status percent aggressive measures like intubation for burst suppression,  but would rather treat conservatively with  antiepileptics.  Recommendations: 1)  Agree with additional Keppra load , Keppra 1000 g twice a day 2)  MRI brain 3)  Following MRI, we'll obtain EEG.   Roland Rack, MD [facility] [phone]  If 7pm- 7am, please page neurology on call as listed in [place].

## 2014-12-10 NOTE — Progress Notes (Signed)
OT Cancellation Note  Patient Details Name: Jay Lester MRN: [id number] DOB: 04-05-1924   Cancelled Treatment:    Reason Eval/Treat Not Completed: Patient at procedure or test/ unavailable (MRI)  Vonita Moss   OTR/L Pager: [phone] Office: [phone] .  12/10/2014, 10:49 AM

## 2014-12-10 NOTE — Progress Notes (Signed)
OT Cancellation Note  Patient Details Name: Junie Engram MRN: [id number] DOB: [birth year]   Cancelled Treatment:    Reason Eval/Treat Not Completed: Patient at procedure or test/ unavailable (EEG and RN request to hold)  Vonita Moss   OTR/L Pager: [phone] Office: [phone] .  12/10/2014, 2:06 PM

## 2014-12-10 NOTE — Progress Notes (Signed)
Inpatient Rehabilitation  I briefly met with Mr. Mullenbach at the bedside while he was being prepped for EEG.  He had great difficulty with communication . I will follow up tomorrow for medical status.    [place] Admissions Coordinator Cell [phone] Office [phone]

## 2014-12-10 NOTE — Progress Notes (Signed)
Speech still slurred off and on with expressive aphasia. Discussed with Dr. Cyndy Freeze on phone.

## 2014-12-11 ENCOUNTER — Inpatient Hospital Stay (HOSPITAL_COMMUNITY): Payer: Medicare Other

## 2014-12-11 LAB — PHENYTOIN LEVEL, TOTAL: PHENYTOIN LVL: 16.9 ug/mL (ref 10.0–20.0)

## 2014-12-11 LAB — SODIUM: SODIUM: 136 mmol/L (ref 135–145)

## 2014-12-11 MED ORDER — SODIUM CHLORIDE 0.9 % IV SOLN
100.0000 mg | Freq: Two times a day (BID) | INTRAVENOUS | Status: DC
  Administered 2014-12-11 – 2014-12-19 (×17): 100 mg via INTRAVENOUS
  Filled 2014-12-11 (×37): qty 10

## 2014-12-11 MED ORDER — LIDOCAINE HCL 2 % EX GEL
1.0000 "application " | Freq: Once | CUTANEOUS | Status: AC
  Administered 2014-12-11: 1 via URETHRAL
  Filled 2014-12-11: qty 5

## 2014-12-11 NOTE — Progress Notes (Signed)
Physical Therapy Treatment Patient Details Name: Jay Lester MRN: [id number] DOB: [birth year] Today's Date: 12/11/2014    History of Present Illness 81 year old male with a symptomatic acute on chronic left frontal subdural hematoma; s/p evacuation of hematoma.    PT Comments    Progressing slowly.  Gait instability with scissoring and ataxia, worsening with fatigue.  Also emphasized safety.   Follow Up Recommendations  CIR;Supervision/Assistance - 24 hour     Equipment Recommendations  Rolling walker with 5" wheels    Recommendations for Other Services       Precautions / Restrictions Precautions Precautions: Fall Restrictions Weight Bearing Restrictions: No    Mobility  Bed Mobility Overal bed mobility: Needs Assistance Bed Mobility: Sit to Supine     Supine to sit: Min assist     General bed mobility comments: assist to position in the bed.  Transfers Overall transfer level: Needs assistance Equipment used: 1 person hand held assist Transfers: Sit to/from Stand Sit to Stand: Min assist         General transfer comment: assist to come forward and steadying assist  Ambulation/Gait Ambulation/Gait assistance: Min assist;Mod assist;+2 physical assistance Ambulation Distance (Feet): 150 Feet Assistive device: 2 person hand held assist (iv pole) Gait Pattern/deviations: Step-through pattern;Scissoring;Ataxic;Narrow base of support;Trunk flexed;Drifts right/left Gait velocity: decreased   General Gait Details: pt having difficulty coordinating gait with scissoring and tendency to lean forward with decreased control of forward momentum.   Stairs            Wheelchair Mobility    Modified Rankin (Stroke Patients Only) Modified Rankin (Stroke Patients Only) Pre-Morbid Rankin Score: No symptoms Modified Rankin: Moderately severe disability     Balance Overall balance assessment: Needs assistance Sitting-balance support: No upper extremity  supported Sitting balance-Leahy Scale: Poor     Standing balance support: Single extremity supported;Bilateral upper extremity supported Standing balance-Leahy Scale: Poor                      Cognition Arousal/Alertness: Awake/alert Behavior During Therapy: WFL for tasks assessed/performed Overall Cognitive Status: Impaired/Different from baseline Area of Impairment: Attention;Following commands;Safety/judgement   Current Attention Level: Sustained   Following Commands: Follows one step commands with increased time Safety/Judgement: Decreased awareness of safety;Decreased awareness of deficits          Exercises      General Comments        Pertinent Vitals/Pain Pain Assessment: Faces Faces Pain Scale: No hurt    Home Living                      Prior Function            PT Goals (current goals can now be found in the care plan section) Acute Rehab PT Goals Patient Stated Goal: to go home for his wifes birthday PT Goal Formulation: With patient Time For Goal Achievement: 12/23/14 Potential to Achieve Goals: Good Progress towards PT goals: Progressing toward goals    Frequency  Min 3X/week    PT Plan Current plan remains appropriate    Co-evaluation             End of Session Equipment Utilized During Treatment: Gait belt Activity Tolerance: Patient tolerated treatment well;Patient limited by fatigue Patient left: in bed;with call bell/phone within reach;with family/visitor present     Time: 1210-1235 PT Time Calculation (min) (ACUTE ONLY): 25 min  Charges:  $Gait Training: 8-22 mins  G Codes:      Nyiesha Beever, [name] 12/11/2014, 12:53 PM 12/11/2014  [name], PT [phone]  (pager)

## 2014-12-11 NOTE — Progress Notes (Signed)
LTVM EEG reapplied and started. [place] made aware.

## 2014-12-11 NOTE — Progress Notes (Signed)
Subjective: Improved compared to yesterday  Exam: Filed Vitals:   12/11/14 1044  BP:   Pulse: 85  Temp:   Resp:    Gen: In bed, NAD Resp: non-labored breathing, no acute distress Abd: soft, nt  Neuro: MS: awake, alert, able to follow commands better than yesterday afternoon. Speech is understandable, thoguh still slurred, mild right facial weakness OZ:HYQM Motor: MAEW, no focal weakness Sensory:intact to LT  Dilantin - 16.9   Impression: 81 year old male admitted with subdural hematoma with subsequent worsening aphasia and dysarthria of unclear etiology. The EEG does not seem to have picked up seizures during the brief periods that I reviewed, but I did wonder about a deep focus not well-visualized on surface EEG and therefore started him on a second anti-epileptic medication. He is showing improvement and I would not favor being more aggressive at this time unless the EEG was clear that there were seizures ongoing.  If the EEG did not show any seizures overnight, I'm not certain that further long-term monitoring would be of significant use. If there was seizure on the overnight EEG, however, then I would favor restarting long-term monitoring at that point.  Recommendations: 1) continue Keppra 1 g twice a day 2) continue Dilantin 100 Miller grams 3 times a day 3) I will continue to follow  Roland Rack, MD [facility] [phone]  If 7pm- 7am, please page neurology on call as listed in [place].

## 2014-12-11 NOTE — Evaluation (Signed)
Clinical/Bedside Swallow Evaluation Patient Details  Name: Jay Lester MRN: [id number] Date of Birth: Feb 12, 1924  Today's Date: 12/11/2014 Time: SLP Start Time (ACUTE ONLY): 1051 SLP Stop Time (ACUTE ONLY): 1113 SLP Time Calculation (min) (ACUTE ONLY): 22 min  Past Medical History:  Past Medical History  Diagnosis Date  . Skin cancer   . Hypertension   . Coronary artery disease     Multivessel, LVEF 60%  . Hyperlipidemia   . Diverticulosis 2010  . Tubular adenoma 2010  . Redundant colon 2010   Past Surgical History:  Past Surgical History  Procedure Laterality Date  . Coronary artery bypass graft  1994    LIMA to LAD, SVG to first and second diagonal, SVG to OM, SVG to PDA  . Colonoscopy  10/29/2008    Dr. Gala Romney- incomplete, redundant elongated colon precluded complete exam, suboptimal prep, diffuse diverticulosis, 2 tubular adenomas  . Colonoscopy  12/09/08    Virtual Colonoscopy-normal  . Craniotomy Left 12/15/2014    Procedure: CRANIOTOMY HEMATOMA EVACUATION SUBDURAL;  Surgeon: Kevan Ny Ditty, MD;  Location: [facility];  Service: Neurosurgery;  Laterality: Left;  left    HPI:  81 year old male with a symptomatic acute on chronic left frontal subdural hematoma; s/p evacuation of hematoma. Jay Lester with new onset of difficulty speaking 8/24. MRI negative for additional acute findings. Per MD note, concern for seizures.   Assessment / Plan / Recommendation Clinical Impression  Jay Lester has reduced strength and sensation on his right side, with poor awareness of anterior loss and residue from both solids and liquids. He requires Mod cues from SLP for oral clearance with lingual sweeps and liquid washes. The majority of his residue is pooled in his anterior sulcus, which results in increased anterior loss but also likely facilitates oral containment posteriorly as no overt signs of aspiration are observed. He appears to have mildly improved oral control with use of a straw as well.  Recommend Dys 2 diet and thin liquids with full supervision. SLP to f/u for diet tolerance and advancement. Of note, Jay Lester continues to have difficulty with expressive communication. Please order SLP speech-language evalaution as appropriate.    Aspiration Risk  Mild    Diet Recommendation Dysphagia 2 (Fine chop);Thin   Medication Administration: Whole meds with puree Compensations: Minimize environmental distractions;Slow rate;Small sips/bites;Check for pocketing;Check for anterior loss;Follow solids with liquid    Other  Recommendations Oral Care Recommendations: Oral care BID   Follow Up Recommendations       Frequency and Duration min 2x/week  2 weeks   Pertinent Vitals/Pain n/a    SLP Swallow Goals     Swallow Study Prior Functional Status       General Other Pertinent Information: 81 year old male with a symptomatic acute on chronic left frontal subdural hematoma; s/p evacuation of hematoma. Jay Lester with new onset of difficulty speaking 8/24. MRI negative for additional acute findings. Per MD note, concern for seizures. Type of Study: Bedside swallow evaluation Previous Swallow Assessment: none in chartq Diet Prior to this Study: NPO (was on regular/thins post-surgery prior to new neuro deficit) Temperature Spikes Noted: No Respiratory Status: Room air History of Recent Intubation: No Behavior/Cognition: Alert;Cooperative;Pleasant mood Oral Cavity - Dentition: Other (Comment) (dentures) Self-Feeding Abilities: Able to feed self;Needs assist Patient Positioning: Upright in chair/Tumbleform Baseline Vocal Quality: Normal Volitional Cough: Strong Volitional Swallow: Able to elicit    Oral/Motor/Sensory Function Overall Oral Motor/Sensory Function: Impaired Labial ROM: Reduced right Labial Symmetry: Abnormal symmetry right Labial  Strength: Reduced Labial Sensation: Reduced Lingual ROM: Within Functional Limits Lingual Symmetry: Within Functional Limits Lingual Strength:  Within Functional Limits Lingual Sensation: Reduced Facial ROM: Reduced right Facial Symmetry: Right droop Facial Strength: Reduced Facial Sensation: Reduced Mandible: Within Functional Limits   Ice Chips Ice chips: Not tested   Thin Liquid Thin Liquid: Impaired Presentation: Cup;Self Fed;Straw Oral Phase Impairments: Poor awareness of bolus;Reduced labial seal Oral Phase Functional Implications: Right anterior spillage;Other (comment) (anterior pooling)    Nectar Thick Nectar Thick Liquid: Not tested   Honey Thick Honey Thick Liquid: Not tested   Puree Puree: Impaired Presentation: Self Fed;Spoon Oral Phase Impairments: Reduced labial seal;Poor awareness of bolus Oral Phase Functional Implications: Other (comment);Right anterior spillage (anterior pooling)   Solid    Solid: Impaired Presentation: Self Fed Oral Phase Impairments: Poor awareness of bolus;Reduced labial seal Oral Phase Functional Implications: Right anterior spillage;Other (comment) (anterior pooling)      Germain Osgood, M.A. CCC-SLP [phone]  Germain Osgood 12/11/2014,12:06 PM

## 2014-12-11 NOTE — Evaluation (Signed)
Occupational Therapy Evaluation Patient Details Name: Jay Lester MRN: [id number] DOB: May 05, 1923 Today's Date: 12/11/2014    History of Present Illness 81 year old male with a symptomatic acute on chronic left frontal subdural hematoma; s/p evacuation of hematoma.   Clinical Impression   Patient is s/p L SDH evacuation surgery resulting in functional limitations due to the deficits listed below (see OT problem list). PTA living at home with wife independently. Patient will benefit from skilled OT acutely to increase independence and safety with ADLS to allow discharge CIR. Pt demonstrates balance deficits affecting all adls and iadls.      Follow Up Recommendations  CIR    Equipment Recommendations   (defer)    Recommendations for Other Services Rehab consult     Precautions / Restrictions Precautions Precautions: Fall Restrictions Weight Bearing Restrictions: No      Mobility Bed Mobility Overal bed mobility: Needs Assistance Bed Mobility: Sit to Supine     Supine to sit: Min assist     General bed mobility comments: assist to position in the bed. cues to avoid pulling on lines  Transfers Overall transfer level: Needs assistance Equipment used: 1 person hand held assist Transfers: Sit to/from Stand Sit to Stand: Min assist         General transfer comment: pt fixated on lines and reaching anterior with IV pole L hand. pt with hip flexion in static standing with cues for upright posture    Balance Overall balance assessment: Needs assistance Sitting-balance support: No upper extremity supported;Feet supported Sitting balance-Leahy Scale: Poor     Standing balance support: Single extremity supported;During functional activity Standing balance-Leahy Scale: Poor                              ADL Overall ADL's : Needs assistance/impaired   Eating/Feeding Details (indicate cue type and reason): pt noted to have decr lip closure and spillage  on gown. pt with decr awareness to spillage on gown. Pt with apple sauce on R hand and seemed unconcerned / aware.    Grooming Details (indicate cue type and reason): mod a to don glasses Upper Body Bathing: Moderate assistance   Lower Body Bathing: Moderate assistance;Sit to/from stand Lower Body Bathing Details (indicate cue type and reason): pt able to sit and pull up socks by hip flexion         Toilet Transfer: Moderate assistance;Ambulation Toilet Transfer Details (indicate cue type and reason): pt with LOB with any head turns and scissored gait         Functional mobility during ADLs: Moderate assistance;Maximal assistance       Vision Vision Assessment?: Yes Eye Alignment: Within Functional Limits Ocular Range of Motion: Other (comment) (difficult to assess because pt moves entire  head) Tracking/Visual Pursuits: Decreased smoothness of horizontal tracking;Unable to hold eye position out of midline;Impaired - to be further tested in functional context Additional Comments: L gaze preference but able to track R. Pt with poor return demo with verbal cues and tactile cues for visual asessment. Pt turning entire head even with OT holding chin. Pt glances to peripheral and immediately return to central vision with assessment   Perception     Praxis      Pertinent Vitals/Pain Pain Assessment: No/denies pain Faces Pain Scale: No hurt     Hand Dominance Right   Extremity/Trunk Assessment Upper Extremity Assessment Upper Extremity Assessment: RUE deficits/detail   Lower Extremity Assessment Lower  Extremity Assessment: Defer to PT evaluation   Cervical / Trunk Assessment Cervical / Trunk Assessment: Kyphotic   Communication Communication Communication: HOH (slurred speech expressive)   Cognition Arousal/Alertness: Awake/alert Behavior During Therapy: WFL for tasks assessed/performed Overall Cognitive Status: Impaired/Different from baseline Area of Impairment:  Attention;Following commands;Safety/judgement;Awareness;Orientation Orientation Level: Disoriented to;Situation Current Attention Level: Sustained Memory: Decreased short-term memory Following Commands: Follows one step commands inconsistently Safety/Judgement: Decreased awareness of safety;Decreased awareness of deficits Awareness: Intellectual   General Comments: Pt pulling at foley with multiple attempts to educate to avoid due to risk of injury due to indwelling. 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[place] - [employer]          Prior Functioning/Environment Level of Independence: Independent        Comments: likes to mow the yard- does a garden in the spring    OT Diagnosis: Generalized weakness;Cognitive deficits   OT Problem List: Decreased strength;Decreased activity tolerance;Impaired balance (sitting and/or standing);Decreased cognition;Decreased safety awareness;Decreased knowledge of use of DME or AE;Decreased knowledge of precautions;Decreased coordination   OT Treatment/Interventions: Self-care/ADL training;Therapeutic exercise;Neuromuscular education;DME and/or AE instruction;Therapeutic activities;Cognitive remediation/compensation;Patient/family education;Balance training    OT Goals(Current goals can be found in the care plan section) Acute Rehab OT Goals Patient Stated Goal: to go home OT Goal Formulation: With patient/family Time For Goal Achievement: 12/25/14 Potential to Achieve Goals: Good  OT  Frequency: Min 3X/week   Barriers to D/C:            Co-evaluation              End of Session Equipment Utilized During Treatment: Gait belt Nurse Communication: Mobility status;Precautions  Activity Tolerance: Patient tolerated treatment well Patient left: in bed;with call bell/phone within reach;with bed alarm set;with family/visitor present   Time: 1207-1235 OT Time Calculation (min): 28 min Charges:  OT General Charges $OT Visit: 1 Procedure OT Evaluation $Initial OT Evaluation Tier I: 1 Procedure G-Codes:    Peri Maris 12-30-2014, 1:59 PM  Jeri Modena   OTR/L Pager: [phone] Office: [phone] .

## 2014-12-11 NOTE — Progress Notes (Signed)
Pt removed all EEG wires, ECG monitor and IV.  Dr. Janann Colonel called and states will have EEG tech come in when they arrive to replace EEG wires and may apply restraints if needed.  New IV started, monitor leads replaced.  Pt's also last bladder emptying was at 2230 on 12/10/14.  Bladder scan at this time shows 518ml in bladder.  Attempted straight cath x 2 by 2 different RNs with no success and minimal bleeding around urethra.  Dr. Christella Noa paged, awaiting return call.  Will continue to monitor.   Jay Lester 12/11/2014 6:31 AM

## 2014-12-11 NOTE — Procedures (Addendum)
Video-EEG (24 hr) Report  Patient: Jay Lester DOB: 21-Dec-1923 MRN: [id number]  Requesting MD: Crista Curb, MD Interpreting MD: Erin Fulling, MD  DOS: 12/11/2014-12/12/2014  Brief History: The patient is an 81 year old gentleman with a history of subdural hematoma who presents with new onset aphasia with right facial weakness. Possibilities include a small seizure versus an ischemic infarct.  A 24-hour EG was obtained to evaluate. Prior recording documented a number of electrographic seizures over the left centro-parietal leads. This is a follow-up recording, after treatment.  Medications: include Keppra, lorazepam,hydrocodone-acetaminophen, among others  (see EMR)  Technical Description:  The recording consists of a 24-hr continuous digital video-EEG recording with 21 electrodes placed according to the International 10-20 System. Extra leads were not specified but included an EKG lead. Activation procedures were not performed.   Electrographic Description: The background while awake was characterized by a 10-11 Hz, 20-30 uV posterior rhythm, marginally better developed on the right, which attenuates appropriately to eye opening. There was a very subtle persistent focal slowing noted over the left centrotemporal leads, consisting of very low voltage polymorphic theta.  During sleep, noted sleep spindles consistent with stage II sleep, also marginally better developed on the right. During this recording, no further seizures were recorded.  INTERPRETATION: This is a mildly abnormal 24-hr continuous video-EEG recording. Significant findings include: 1. Persistent minimal focal slowing, low voltage polymorphic theta, left centro-temporal 4. Marginal asymmetry, posterior rhythm and sleep spindles better developed on the right  CLINICAL CORRELATION: No seizures were documented in this recording. The findings suggest mild focal cortical dysfunction in the left centro-parietal region, but this  was substantially less prominent then in the prior 24 hr recording, suggesting that much of the persistent slowing noted in the prior recording may have been a post-ictal phenomenon, rather than structural pathology.  Allayne Stack Cheral Almas, MD [place], [place]  Time signed: 6:20 PM      Video-EEG (24 hr) Report  Patient: Jay Lester DOB: Jan 01, 1924 MRN: [id number]  Requesting MD: Crista Curb, MD Interpreting MD: Erin Fulling, MD  DOS: 12/11/2014  Brief History: The patient is an 81 year old gentleman with a history of subdural hematoma who presents with new onset aphasia with right facial weakness. Possibilities include a small seizure versus an ischemic infarct.  A 24-hour EG was obtained to evaluate.  Medications: include Keppra, lorazepam,hydrocodone-acetaminophen, among others  (see EMR)  Technical Description:  The recording consists of a 24-hr continuous digital video-EEG recording with 21 electrodes placed according to the International 10-20 System. Extra leads were not specified but included an EKG lead. Activation procedures were not performed.   Electrographic Description: The background while awake was characterized by a 10-11 Hz, 20-30 uV posterior rhythm, better developed on the right, which attenuates appropriately to eye opening. There was a very subtle persistent focal slowing noted over the left centrotemporal leads, consisting of low voltage polymorphic theta.  During sleep, noted sleep spindles consistent with stage II sleep, also better developed on the right. Also during sleep noted intermittent slowing, moderate voltage semirhythmic delta, phase-reversing over C3-P3.  Later in the recording, mainly during sleep, this rhythmic delta evolves somewhat and includes low voltage spike-wave complexes.  No obvious clinical correlate on video-review (although details of the face were not visible). A subtotal  listing of seizures included: 6:45 PM, 7:24 PM, 8:05 PM, 9:47 PM, 10:46 PM, 1:36 AM, 2:05 AM, 2:31 AM, 2:59 AM, 4:28 AM.  FP1 detached at 3:13  AM, and then the patient removed all electrodes at about 4:40 AM.  INTERPRETATION: This is an abnormal 24-hr continuous video-EEG recording. Significant findings include: 1. Recurrent electrographic seizures, left centro-parietal 2. Intermittent slowing, moderate voltage rhythmic delta, left centro-parietal 3. Persistent slowing, low voltage polymorphic theta, left centro-temporal broadly 4. Asymmetry, posterior rhythm and sleep spindles better developed on the right  CLINICAL CORRELATION: The findings indicate focal cortical dysfunction in the left centro-parietal region, with strong epileptogenic potential. The absence of obvious clinical correlate will make it difficult to assess management without simultaneous EEG recording. The persistence of slowing probably does reflect in part prolonged/recurrent focal seizure activity, but also raises a question of underlying structural pathology. Clinical correlation advised. Findings discussed with Neurology (Dr. Leonel Ramsay) at the time of the interpretation.  Allayne Stack Cheral Almas, MD [facility] [place], [place]  Time signed: 4:38 PM

## 2014-12-11 NOTE — Progress Notes (Signed)
Agitated overnight, pulled off EEG leads.  Required  AVSS Awake, somewhat lethargic but conversant, oriented x1 Still slurring speech somewhat but I am able to understand most of the words he says Moving all extremities well, no drift Incision c/d/i Resume LTEEG Continue AEDs Will leave foley in place for now

## 2014-12-12 DIAGNOSIS — R569 Unspecified convulsions: Secondary | ICD-10-CM

## 2014-12-12 LAB — SODIUM: Sodium: 134 mmol/L — ABNORMAL LOW (ref 135–145)

## 2014-12-12 MED ORDER — BISACODYL 10 MG RE SUPP: 10.0000 mg | Freq: Every day | RECTAL | Status: DC | PRN

## 2014-12-12 MED ORDER — DOCUSATE SODIUM 100 MG PO CAPS
200.0000 mg | ORAL_CAPSULE | Freq: Two times a day (BID) | ORAL | Status: DC
  Administered 2014-12-12 (×2): 200 mg via ORAL
  Filled 2014-12-12 (×2): qty 2

## 2014-12-12 MED ORDER — BISACODYL 5 MG PO TBEC: 10.0000 mg | DELAYED_RELEASE_TABLET | Freq: Every day | ORAL | Status: DC | PRN

## 2014-12-12 NOTE — Progress Notes (Signed)
Patient transferred from [facility] via bed with sitted and family at bedside. On EEG machine.

## 2014-12-12 NOTE — Progress Notes (Signed)
No acute events EEG report back which describes multiple seizures Sodium 134 AVSS Awake and alert, speech imperfect but much improved, oriented x2 Moving all extremities well, no drift Incision c/d/i Neurologically improved Continue seizure monitoring until improved Continue AEDs Reduce fluids to 30cc/hr Transfer to stepdown

## 2014-12-12 NOTE — Progress Notes (Signed)
Speech Language Pathology Treatment: Dysphagia  Patient Details Name: Jay Lester MRN: [id number] DOB: 1923/11/13 Today's Date: 12/12/2014 Time: 0488-8916 SLP Time Calculation (min) (ACUTE ONLY): 16 min  Assessment / Plan / Recommendation Clinical Impression  Pt's sitter and RN stated pt talking while eating breakfast and may have aspirated (?). Pt seen at bedside with regular texture for possible upgrade, however right side pocketing and lingual residue present. He independently used finger in attempts to clear and needed min cues to remove lingual residue. Pt wheezing (RN aware and called RT for neb treatment). No signs aspiration, however increased respiratory rate increases risk. Continue Dys 2 texture and thin liquids. Asked RN to downgrade to nectar if difficulty is observed or he exhibits dyspnea. Will continue to follow.   HPI Other Pertinent Information: 81 year old male with a symptomatic acute on chronic left frontal subdural hematoma; s/p evacuation of hematoma. Pt with new onset of difficulty speaking 8/24. MRI negative for additional acute findings. Per MD note, concern for seizures.   Pertinent Vitals Pain Assessment: No/denies pain  SLP Plan  Continue with current plan of care    Recommendations Diet recommendations: Dysphagia 2 (fine chop);Thin liquid Liquids provided via: Straw Medication Administration: Whole meds with puree Supervision: Patient able to self feed;Full supervision/cueing for compensatory strategies Compensations: Minimize environmental distractions;Slow rate;Small sips/bites;Check for pocketing;Check for anterior loss;Follow solids with liquid Postural Changes and/or Swallow Maneuvers: Seated upright 90 degrees              Oral Care Recommendations: Oral care BID Follow up Recommendations:  (TBD) Plan: Continue with current plan of care    GO     Houston Siren 12/12/2014, 2:10 PM  Orbie Pyo Colvin Caroli.Ed [employer] [phone]

## 2014-12-12 NOTE — Progress Notes (Signed)
Inpatient Rehabilitation  I made a follow up visit with pt. and family at the bedside.  Pt. continues with EEG, not medically ready for IP Rehab.  I continue to await response from [facility].  I will follow up Monday .  Pt. To be transferred to SDU today.  Please call if questions.  [place] Admissions Coordinator Cell [phone] Office [phone]

## 2014-12-12 NOTE — Progress Notes (Addendum)
Subjective: Much improved.   Exam: Filed Vitals:   12/12/14 0801  BP:   Pulse:   Temp: 98.1 F (36.7 C)  Resp:    Gen: In bed, NAD Resp: non-labored breathing, no acute distress Abd: soft, nt  Neuro: MS: awake, alert, able to follow commands speech much more clear than previous days.  RX:YVOP Motor: MAEW, no focal weakness Sensory:intact to LT  Dilantin - 16.9   Impression: 81 year old male admitted with subdural hematoma with subsequent worsening aphasia and dysarthria due to frequent seizures. He continued to have seizures despite keppra and dilantin and therefore vimpat was added. He has had continued improvement since the addition of phenytoin.    Recommendations: 1) continue Keppra 1 g twice a day 2) continue Dilantin 100 mg 3 times a day 3) continue vimpat 100mg  BID, could increase this if needed.  3) will f/u EEG, further LTM depending on results.   Roland Rack, MD [facility] [phone]  If 7pm- 7am, please page neurology on call as listed in [place].

## 2014-12-12 NOTE — Progress Notes (Signed)
LTM checked, no skin breakdown noted 

## 2014-12-12 NOTE — Care Management Important Message (Signed)
Important Message  Patient Details  Name: Jay Lester MRN: [id number] Date of Birth: 03/18/1924   Medicare Important Message Given:  Yes-third notification given    Ella Bodo, RN 12/12/2014, 5:20 PM

## 2014-12-12 NOTE — Care Management Note (Signed)
Case Management Note  Patient Details  Name: Filippo Puls MRN: [id number] Date of Birth: [birth year]  Subjective/Objective:   Pt admitted on 12/17/2014 with subdural hematoma.  PTA, pt resided at home with spouse.  PT/OT recommending CIR.                 Action/Plan: Rehab consult in process.  Will follow.    Expected Discharge Date:                  Expected Discharge Plan:  IP Rehab Facility  In-House Referral:  Clinical Social Work  Discharge planning Services  CM Consult  Post Acute Care Choice:    Choice offered to:     DME Arranged:    DME Agency:     HH Arranged:    [place] Agency:     Status of Service:  In process, will continue to follow  Medicare Important Message Given:  Yes-second notification given Date Medicare IM Given:    Medicare IM give by:    Date Additional Medicare IM Given:    Additional Medicare Important Message give by:     If discussed at [place] of Stay Meetings, dates discussed:    Additional Comments:  Reinaldo Raddle, RN, BSN  Trauma/Neuro ICU Case Manager [phone]

## 2014-12-13 ENCOUNTER — Inpatient Hospital Stay (HOSPITAL_COMMUNITY): Payer: Medicare Other

## 2014-12-13 DIAGNOSIS — J690 Pneumonitis due to inhalation of food and vomit: Secondary | ICD-10-CM | POA: Diagnosis present

## 2014-12-13 DIAGNOSIS — J9601 Acute respiratory failure with hypoxia: Secondary | ICD-10-CM

## 2014-12-13 LAB — TRIGLYCERIDES: TRIGLYCERIDES: 59 mg/dL (ref <150)

## 2014-12-13 LAB — URINALYSIS, ROUTINE W REFLEX MICROSCOPIC
Bilirubin Urine: NEGATIVE
GLUCOSE, UA: 100 mg/dL — AB
HGB URINE DIPSTICK: NEGATIVE
KETONES UR: NEGATIVE mg/dL
Nitrite: NEGATIVE
PROTEIN: NEGATIVE mg/dL
Specific Gravity, Urine: 1.025 (ref 1.005–1.030)
UROBILINOGEN UA: 0.2 mg/dL (ref 0.0–1.0)
pH: 5.5 (ref 5.0–8.0)

## 2014-12-13 LAB — POCT I-STAT 3, ART BLOOD GAS (G3+)
Acid-Base Excess: 3 mmol/L — ABNORMAL HIGH (ref 0.0–2.0)
Bicarbonate: 29.8 mEq/L — ABNORMAL HIGH (ref 20.0–24.0)
O2 SAT: 91 %
TCO2: 31 mmol/L (ref 0–100)
pCO2 arterial: 54.4 mmHg — ABNORMAL HIGH (ref 35.0–45.0)
pH, Arterial: 7.346 — ABNORMAL LOW (ref 7.350–7.450)
pO2, Arterial: 67 mmHg — ABNORMAL LOW (ref 80.0–100.0)

## 2014-12-13 LAB — STREP PNEUMONIAE URINARY ANTIGEN: STREP PNEUMO URINARY ANTIGEN: NEGATIVE

## 2014-12-13 LAB — CBC
HEMATOCRIT: 32.2 % — AB (ref 39.0–52.0)
Hemoglobin: 11 g/dL — ABNORMAL LOW (ref 13.0–17.0)
MCH: 30.8 pg (ref 26.0–34.0)
MCHC: 34.2 g/dL (ref 30.0–36.0)
MCV: 90.2 fL (ref 78.0–100.0)
Platelets: 142 10*3/uL — ABNORMAL LOW (ref 150–400)
RBC: 3.57 MIL/uL — AB (ref 4.22–5.81)
RDW: 14.2 % (ref 11.5–15.5)
WBC: 11.2 10*3/uL — AB (ref 4.0–10.5)

## 2014-12-13 LAB — BASIC METABOLIC PANEL
ANION GAP: 7 (ref 5–15)
BUN: 14 mg/dL (ref 6–20)
CO2: 30 mmol/L (ref 22–32)
Calcium: 8.4 mg/dL — ABNORMAL LOW (ref 8.9–10.3)
Chloride: 100 mmol/L — ABNORMAL LOW (ref 101–111)
Creatinine, Ser: 1.35 mg/dL — ABNORMAL HIGH (ref 0.61–1.24)
GFR calc Af Amer: 52 mL/min — ABNORMAL LOW (ref >60)
GFR calc non Af Amer: 45 mL/min — ABNORMAL LOW (ref >60)
GLUCOSE: 144 mg/dL — AB (ref 65–99)
POTASSIUM: 3.9 mmol/L (ref 3.5–5.1)
Sodium: 137 mmol/L (ref 135–145)

## 2014-12-13 LAB — BLOOD GAS, ARTERIAL
ACID-BASE EXCESS: 2.7 mmol/L — AB (ref 0.0–2.0)
BICARBONATE: 28.9 meq/L — AB (ref 20.0–24.0)
DRAWN BY: 213381
FIO2: 1
O2 SAT: 87.7 %
PATIENT TEMPERATURE: 98.5
TCO2: 30.9 mmol/L (ref 0–100)
pCO2 arterial: 64.1 mmHg (ref 35.0–45.0)
pH, Arterial: 7.277 — ABNORMAL LOW (ref 7.350–7.450)
pO2, Arterial: 67.7 mmHg — ABNORMAL LOW (ref 80.0–100.0)

## 2014-12-13 LAB — PHENYTOIN LEVEL, TOTAL: PHENYTOIN LVL: 13.1 ug/mL (ref 10.0–20.0)

## 2014-12-13 LAB — URINE MICROSCOPIC-ADD ON

## 2014-12-13 LAB — SODIUM: SODIUM: 133 mmol/L — AB (ref 135–145)

## 2014-12-13 LAB — PHOSPHORUS: Phosphorus: 3.3 mg/dL (ref 2.5–4.6)

## 2014-12-13 LAB — MAGNESIUM: Magnesium: 1.4 mg/dL — ABNORMAL LOW (ref 1.7–2.4)

## 2014-12-13 MED ORDER — PANTOPRAZOLE SODIUM 40 MG IV SOLR
40.0000 mg | INTRAVENOUS | Status: DC
  Administered 2014-12-13 – 2014-12-18 (×6): 40 mg via INTRAVENOUS
  Filled 2014-12-13 (×8): qty 40

## 2014-12-13 MED ORDER — ANTISEPTIC ORAL RINSE SOLUTION (CORINZ)
7.0000 mL | Freq: Four times a day (QID) | OROMUCOSAL | Status: DC
  Administered 2014-12-13 – 2014-12-19 (×21): 7 mL via OROMUCOSAL

## 2014-12-13 MED ORDER — PROPOFOL 1000 MG/100ML IV EMUL
0.0000 ug/kg/min | INTRAVENOUS | Status: DC
  Administered 2014-12-13: 5 ug/kg/min via INTRAVENOUS
  Filled 2014-12-13: qty 100

## 2014-12-13 MED ORDER — FENTANYL CITRATE (PF) 100 MCG/2ML IJ SOLN
INTRAMUSCULAR | Status: AC
  Filled 2014-12-13: qty 2

## 2014-12-13 MED ORDER — FENTANYL CITRATE (PF) 100 MCG/2ML IJ SOLN
50.0000 ug | INTRAMUSCULAR | Status: DC | PRN
  Filled 2014-12-13: qty 2

## 2014-12-13 MED ORDER — MIDAZOLAM HCL 2 MG/2ML IJ SOLN
1.0000 mg | INTRAMUSCULAR | Status: DC | PRN
  Administered 2014-12-13 (×2): 1 mg via INTRAVENOUS
  Administered 2014-12-13: 2 mg via INTRAVENOUS
  Administered 2014-12-13 – 2014-12-14 (×3): 1 mg via INTRAVENOUS
  Filled 2014-12-13 (×5): qty 2

## 2014-12-13 MED ORDER — BISACODYL 10 MG RE SUPP
10.0000 mg | Freq: Every day | RECTAL | Status: DC
  Filled 2014-12-13: qty 1

## 2014-12-13 MED ORDER — MAGNESIUM SULFATE 2 GM/50ML IV SOLN
2.0000 g | Freq: Once | INTRAVENOUS | Status: AC
  Administered 2014-12-13: 2 g via INTRAVENOUS
  Filled 2014-12-13: qty 50

## 2014-12-13 MED ORDER — VANCOMYCIN HCL 10 G IV SOLR
1250.0000 mg | Freq: Once | INTRAVENOUS | Status: AC
  Administered 2014-12-13: 1250 mg via INTRAVENOUS
  Filled 2014-12-13: qty 1250

## 2014-12-13 MED ORDER — ETOMIDATE 2 MG/ML IV SOLN
20.0000 mg | Freq: Once | INTRAVENOUS | Status: AC
  Administered 2014-12-13: 20 mg via INTRAVENOUS

## 2014-12-13 MED ORDER — SODIUM CHLORIDE 0.9 % IV BOLUS (SEPSIS)
1000.0000 mL | Freq: Once | INTRAVENOUS | Status: AC
  Administered 2014-12-13: 1000 mL via INTRAVENOUS

## 2014-12-13 MED ORDER — METOPROLOL TARTRATE 25 MG/10 ML ORAL SUSPENSION
12.5000 mg | Freq: Two times a day (BID) | ORAL | Status: DC
  Filled 2014-12-13 (×2): qty 5

## 2014-12-13 MED ORDER — DOCUSATE SODIUM 50 MG/5ML PO LIQD: 100.0000 mg | Freq: Two times a day (BID) | ORAL | Status: DC | PRN

## 2014-12-13 MED ORDER — CHLORHEXIDINE GLUCONATE 0.12% ORAL RINSE (MEDLINE KIT)
15.0000 mL | Freq: Two times a day (BID) | OROMUCOSAL | Status: DC
  Administered 2014-12-13 – 2014-12-19 (×13): 15 mL via OROMUCOSAL

## 2014-12-13 MED ORDER — FENTANYL CITRATE (PF) 100 MCG/2ML IJ SOLN
50.0000 ug | INTRAMUSCULAR | Status: DC | PRN
  Administered 2014-12-13 – 2014-12-14 (×2): 50 ug via INTRAVENOUS
  Filled 2014-12-13 (×2): qty 2

## 2014-12-13 MED ORDER — MIDAZOLAM HCL 2 MG/2ML IJ SOLN
2.0000 mg | Freq: Once | INTRAMUSCULAR | Status: AC
  Administered 2014-12-13: 2 mg via INTRAVENOUS

## 2014-12-13 MED ORDER — FENTANYL CITRATE (PF) 100 MCG/2ML IJ SOLN
100.0000 ug | Freq: Once | INTRAMUSCULAR | Status: AC
  Administered 2014-12-13: 100 ug via INTRAVENOUS

## 2014-12-13 MED ORDER — MIDAZOLAM HCL 2 MG/2ML IJ SOLN
INTRAMUSCULAR | Status: AC
  Filled 2014-12-13: qty 2

## 2014-12-13 MED ORDER — SODIUM CHLORIDE 0.9 % IV BOLUS (SEPSIS)
1000.0000 mL | Freq: Once | INTRAVENOUS | Status: AC
Start: 2014-12-13 — End: 2014-12-13
  Administered 2014-12-13: 1000 mL via INTRAVENOUS

## 2014-12-13 MED ORDER — ROCURONIUM BROMIDE 50 MG/5ML IV SOLN: 50.0000 mg | Freq: Once | INTRAVENOUS | Status: DC

## 2014-12-13 MED ORDER — AMLODIPINE BESYLATE 10 MG PO TABS
10.0000 mg | ORAL_TABLET | Freq: Every day | ORAL | Status: DC
  Administered 2014-12-14 – 2014-12-18 (×3): 10 mg
  Filled 2014-12-13 (×9): qty 1

## 2014-12-13 MED ORDER — POTASSIUM CHLORIDE 10 MEQ/50ML IV SOLN
10.0000 meq | Freq: Once | INTRAVENOUS | Status: AC
  Administered 2014-12-13: 10 meq via INTRAVENOUS
  Filled 2014-12-13: qty 50

## 2014-12-13 MED ORDER — ACETAMINOPHEN 160 MG/5ML PO SOLN: 650.0000 mg | ORAL | Status: DC | PRN

## 2014-12-13 MED ORDER — PIPERACILLIN-TAZOBACTAM 3.375 G IVPB
3.3750 g | Freq: Three times a day (TID) | INTRAVENOUS | Status: DC
  Administered 2014-12-13 – 2014-12-18 (×16): 3.375 g via INTRAVENOUS
  Filled 2014-12-13 (×18): qty 50

## 2014-12-13 NOTE — Progress Notes (Signed)
D/t soft BP, 80s/40s-50s, propofol stopped and prn Versed ordered per NP. Will continue to monitor.

## 2014-12-13 NOTE — Progress Notes (Signed)
Found patient with O2 sats 88% difficulty breathing. Lung crackles, wheezing, rhonchi bilateral. Oxygen increased to 100% NRB. RT notified, RR RN notified. Lethargic, difficulty to arouse and maintain eye contact. Pupils equal, round, reactive, 2+ brisk bilaterally. Dr. Arnoldo Morale notified. New orders for ABG and transfer to ICU. Oxygen 89% 100% NRB.

## 2014-12-13 NOTE — Procedures (Signed)
Central Venous Catheter Insertion Procedure Note Jay Lester [id number] [birth year]  Procedure: Insertion of Central Venous Catheter Indications: Assessment of intravascular volume, Drug and/or fluid administration and Frequent blood sampling  Procedure Details Consent: Risks of procedure as well as the alternatives and risks of each were explained to the (patient/caregiver).  Consent for procedure obtained. Time Out: Verified patient identification, verified procedure, site/side was marked, verified correct patient position, special equipment/implants available, medications/allergies/relevent history reviewed, required imaging and test results available.  Performed  Maximum sterile technique was used including antiseptics, cap, gloves, gown, hand hygiene, mask and sheet. Skin prep: Chlorhexidine; local anesthetic administered A antimicrobial bonded/coated triple lumen catheter was placed in the left internal jugular vein using the Seldinger technique. Ultrasound guidance used.Yes.   Catheter placed to 20 cm. Blood aspirated via all 3 ports and then flushed x 3. Line sutured x 2 and dressing applied.  Evaluation Blood flow good Complications: No apparent complications Patient did tolerate procedure well. Chest X-ray ordered to verify placement.  CXR: pending.       Richardson Landry Zariel Capano ACNP Maryanna Shape PCCM Pager [phone] till 3 pm If no answer page [phone] 12/13/2014, 10:40 AM

## 2014-12-13 NOTE — Progress Notes (Signed)
NP at bedside visualized and confirmed placement of ETT, central line and OG placement on xray.

## 2014-12-13 NOTE — Progress Notes (Addendum)
RN concerned about absent bowel sounds and distended abdomen  AXR  Dg Chest Port 1 View  12/13/2014   CLINICAL DATA:  Endotracheal tube and central line placement.  EXAM: PORTABLE CHEST - 1 VIEW  COMPARISON:  12/13/2014; 12/10/2014  FINDINGS: Grossly unchanged borderline enlarged cardiac silhouette and mediastinal contours with atherosclerotic plaque within the thoracic aorta. Post median sternotomy CABG. Several fractured sternotomy wires are again incidentally noted.  Interval intubation with endotracheal tube overlying the tracheal air column with tip approximately 4.5 cm above the carina. Left jugular approach central venous catheter with tip projected over the superior cavoatrial junction. Enteric tube tip and side port project below the left hemidiaphragm. No pneumothorax.  Worsening bibasilar heterogeneous opacities, right greater than left. No definite pleural effusion or evidence of edema. Unchanged bones.  IMPRESSION: 1. Support apparatus as above.  No pneumothorax. 2. Worsening bibasilar heterogeneous opacities potentially atelectasis though worrisome for aspiration. Continued attention on follow-up is recommended.   Electronically Signed   By: Sandi Mariscal M.D.   On: 12/13/2014 11:22   Dg Chest Port 1 View  12/13/2014   CLINICAL DATA:  Cough.  EXAM: PORTABLE CHEST - 1 VIEW  COMPARISON:  12/10/2014  FINDINGS: There is patchy opacity at the right lung base. Bronchopneumonia is suspected. Milder similar opacity is noted to medial lung base which may reflect additional pneumonia or be due to atelectasis.  No convincing pulmonary edema.  No pleural effusion or pneumothorax.  Stable changes from previous CABG surgery are noted. No mediastinal or hilar masses or convincing adenopathy.  IMPRESSION: 1. Since the prior exam, opacity in the lung bases, right greater the left, has increased. Right base opacity is consistent with bronchopneumonia. Left base opacity may reflect atelectasis or additional  pneumonia. No pulmonary edema.   Electronically Signed   By: Lajean Manes M.D.   On: 12/13/2014 09:44   Dg Abd Portable 1v  12/13/2014   CLINICAL DATA:  Acute distention of the abdomen.  EXAM: PORTABLE ABDOMEN - 1 VIEW  COMPARISON:  None.  FINDINGS: NG tube is in place with the tip in the peripyloric region. There is gaseous distention of small bowel loops concerning for small bowel obstruction. No visible free air on this supine image. No organomegaly or suspicious calcification.  IMPRESSION: Dilated small bowel loops diffusely concerning for small bowel obstruction.   Electronically Signed   By: Rolm Baptise M.D.   On: 12/13/2014 13:57    A) SBO - likely post op ileus   Recent Labs Lab 11/21/2014 1612 11/25/2014 2253 12/10/14 1021  K 4.2 4.1 3.7    P) DC all po meds NG tube to LIS Will need to k > 4 and  Mag > 2 Conservative Rx for now.  Possible Dr Marlou Starks  Of [facility] to consult sometime later   Dr. Brand Males, M.D., [facility].C.P Pulmonary and Critical Care Medicine Staff Physician [facility] Pager: [phone], If no answer or between  15:00h - 7:00h: call [phone]  12/13/2014 2:18 PM

## 2014-12-13 NOTE — Significant Event (Addendum)
Rapid Response Event Note  Overview: Time Called: 0740 Arrival Time: 0744 Event Type: Respiratory  Initial Focused Assessment:  Called by Rn to evaluate patient with respiratory distress and requiring increased oxygen.  Upon my arrival to patients room, RN at bedside.  Patient sitting up in bed on NRB mask sats 88-90%.  Patient lethargic, skin warm and dry.  RR 28,  151/83, HR 101, temp 97.5   Interventions:  Breath sounds crackles and wheezes b/l and decreased at bases.  RT called for ABG.  Attempted to insert PIV in left forearm which was unsuccessful.  IV team RN at bedside and obtained PIV in left upper arm.  Dr. Arnoldo Morale paged and notified and orders received to transfer patient to ICU.  Family arrived at bedside and updated on patient status and informed of transfer.  ABG results 7.27, Co2 64.3, o2 67.3, hco3 28.9, 88% on NRB mask   Event Summary:  Patient transferred to 3 M 11 via bed with monitor and NRB mask.  Dr. Arnoldo Morale at bedside on arrival to unit.  ABG results given to Dr. Arnoldo Morale   at      at          [facility]

## 2014-12-13 NOTE — Progress Notes (Signed)
Wife and daughter updated.

## 2014-12-13 NOTE — Progress Notes (Signed)
eLink Physician-Brief Progress Note Patient Name: Jay Lester DOB: 05/26/1923 MRN: [id number]   Date of Service  12/13/2014  HPI/Events of Note  Hypotension - SBP drops into 80's s/p sedation.   eICU Interventions  Will order: 1.  Bolus with 0.9 NaCl 1 liter IV over 1 hour now.  2. Increase IV fluids to 0.9 NaCl at 100 mL/hour.      Intervention Category Intermediate Interventions: Hypotension - evaluation and management  Rebbeca Sheperd Eugene 12/13/2014, 4:40 PM

## 2014-12-13 NOTE — Progress Notes (Signed)
Report called to [place], [place]. To be transferred via bed on tele and oxygen to 3MW11. Family at bedside and updated. Belongings sent with patient.

## 2014-12-13 NOTE — Consult Note (Signed)
PULMONARY / CRITICAL CARE MEDICINE   Name: Jay Lester MRN: [id number] DOB: [birth year]    ADMISSION DATE:  12/04/2014 CONSULTATION DATE:  8/27  REFERRING MD :  Neuro  CHIEF COMPLAINT:  Resp distress  INITIAL PRESENTATION: SDH  STUDIES:    SIGNIFICANT EVENTS: 8/27 intubated   HISTORY OF PRESENT ILLNESS:   81 yo WM with evacuation of left frontal SDH 8/22 and had done well post op until 8/27 when he developed worsening dysarthria, frequent seizures, suspected aspirations and poor airway protection. PCCM was consulted and urgent intubation intubation  Performed. PCCM will assist  In his care  PAST MEDICAL HISTORY :   has a past medical history of Skin cancer; Hypertension; Coronary artery disease; Hyperlipidemia; Diverticulosis (2010); Tubular adenoma (2010); and Redundant colon (2010).  has past surgical history that includes Coronary artery bypass graft (1994); Colonoscopy (10/29/2008); Colonoscopy (12/09/08); and Craniotomy (Left, 12/04/2014). Prior to Admission medications   Medication Sig Start Date End Date Taking? Authorizing Provider  albuterol (PROAIR HFA) 108 (90 BASE) MCG/ACT inhaler Inhale 2 puffs into the lungs every 6 (six) hours as needed. Shortness of breath   Yes Historical Provider, MD  alfuzosin (UROXATRAL) 10 MG 24 hr tablet Take 10 mg by mouth daily.   Yes Historical Provider, MD  amLODipine (NORVASC) 10 MG tablet TAKE 1 TABLET BY MOUTH ONCE DAILY. 07/16/14  Yes Satira Sark, MD  LORazepam (ATIVAN) 0.5 MG tablet Take 0.5 mg by mouth at bedtime as needed for anxiety or sleep.  09/17/12  Yes Historical Provider, MD  losartan (COZAAR) 100 MG tablet TAKE 1 TABLET BY MOUTH ONCE DAILY. 11/18/14  Yes Satira Sark, MD  metoprolol succinate (TOPROL-XL) 25 MG 24 hr tablet TAKE 1/2 TABLET BY MOUTH ONCE DAILY. 07/16/14  Yes Satira Sark, MD  nitroGLYCERIN (NITROSTAT) 0.4 MG SL tablet Place 1 tablet (0.4 mg total) under the tongue every 5 (five) minutes as needed. 06/22/11   Yes Satira Sark, MD  Omega-3 Fatty Acids (FISH OIL BURP-LESS PO) Take 1 capsule by mouth every morning.   Yes Historical Provider, MD  simvastatin (ZOCOR) 10 MG tablet TAKE 1 TABLET BY MOUTH AT BEDTIME. 07/16/14  Yes Satira Sark, MD   No Known Allergies  FAMILY HISTORY:  has no family status information on file.  SOCIAL HISTORY:  reports that he quit smoking about 22 years ago. He does not have any smokeless tobacco history on file. He reports that he does not drink alcohol or use illicit drugs.  REVIEW OF SYSTEMS:  na  SUBJECTIVE:   VITAL SIGNS: Temp:  [97.3 F (36.3 C)-98.6 F (37 C)] 97.5 F (36.4 C) (08/27 0745) Pulse Rate:  [75-101] 101 (08/27 0815) Resp:  [16-27] 27 (08/27 0815) BP: (132-159)/(64-87) 151/83 mmHg (08/27 0815) SpO2:  [87 %-98 %] 93 % (08/27 0815) FiO2 (%):  [15 %-100 %] 100 % (08/27 0915) HEMODYNAMICS:   VENTILATOR SETTINGS: Vent Mode:  [-] PRVC FiO2 (%):  [15 %-100 %] 100 % Set Rate:  [16 bmp] 16 bmp Vt Set:  [530 mL] 530 mL PEEP:  [5 cmH20] 5 cmH20 Plateau Pressure:  [24 cmH20] 24 cmH20 INTAKE / OUTPUT:  Intake/Output Summary (Last 24 hours) at 12/13/14 1042 Last data filed at 12/13/14 0600  Gross per 24 hour  Intake   1070 ml  Output    375 ml  Net    695 ml    PHYSICAL EXAMINATION: General: EWM sedated on vent Neuro:  Sedated no follows commands  HEENT:  OTT-> vent OGT Cardiovascular:  HSR RRR Lungs:  Coarse rhonchi bilat Abdomen:  Soft +bs Musculoskeletal:  intact Skin: L crani incision noted   LABS:  CBC  Recent Labs Lab 12/05/2014 1612 11/29/2014 2253 12/10/14 1021  WBC 8.2 7.6 13.4*  HGB 12.6* 12.4* 11.0*  HCT 36.9* 36.6* 32.0*  PLT 139* 125* 111*   Coag's  Recent Labs Lab 11/19/2014 2253  APTT 35  INR 1.23   BMET  Recent Labs Lab 11/21/2014 1612 11/18/2014 2253 12/10/14 1021 12/11/14 0330 12/12/14 0253 12/13/14 0318  NA 137 141 135  136 136 134* 133*  K 4.2 4.1 3.7  --   --   --   CL 102 104 102   --   --   --   CO2 27 30 27   --   --   --   BUN 24* 16 10  --   --   --   CREATININE 1.13 1.14 1.02  --   --   --   GLUCOSE 101* 108* 109*  --   --   --    Electrolytes  Recent Labs Lab 11/29/2014 1612 11/17/2014 2253 12/10/14 1021  CALCIUM 9.2 9.5 8.5*   Sepsis Markers  Recent Labs Lab 11/18/2014 1613  LATICACIDVEN 0.7   ABG  Recent Labs Lab 12/13/14 0805  PHART 7.277*  PCO2ART 64.1*  PO2ART 67.7*   Liver Enzymes  Recent Labs Lab 12/05/2014 1612  AST 21  ALT 14*  ALKPHOS 45  BILITOT 0.9  ALBUMIN 4.1   Cardiac Enzymes  Recent Labs Lab 11/30/2014 1612  TROPONINI <0.03   Glucose No results for input(s): GLUCAP in the last 168 hours.  Imaging Dg Chest Port 1 View  12/13/2014   CLINICAL DATA:  Cough.  EXAM: PORTABLE CHEST - 1 VIEW  COMPARISON:  12/10/2014  FINDINGS: There is patchy opacity at the right lung base. Bronchopneumonia is suspected. Milder similar opacity is noted to medial lung base which may reflect additional pneumonia or be due to atelectasis.  No convincing pulmonary edema.  No pleural effusion or pneumothorax.  Stable changes from previous CABG surgery are noted. No mediastinal or hilar masses or convincing adenopathy.  IMPRESSION: 1. Since the prior exam, opacity in the lung bases, right greater the left, has increased. Right base opacity is consistent with bronchopneumonia. Left base opacity may reflect atelectasis or additional pneumonia. No pulmonary edema.   Electronically Signed   By: Lajean Manes M.D.   On: 12/13/2014 09:44     ASSESSMENT / PLAN:  PULMONARY OETT 8/27>> A: VDRF secondary to aspiration  and PNA P:   Vent bundle Abx see id  CARDIOVASCULAR CVL left I J cvl 8/27>> A: No acute issue P:    RENAL A:   No acute issue P:    GASTROINTESTINAL A:   Malnourished P:   OGT Start tube feeds soon  HEMATOLOGIC A:  No acute issue P:    INFECTIOUS A:   Suspected aspiration P:   BCx2  8/27>> UC8/27>> Sputum  8/27>> Abx: 8/27 vanc>> 8/27 pip-tazo>>  ENDOCRINE A:   No acute issue  P:     NEUROLOGIC A:   L sdh post evac 8/22 with worsening mental status 8/27 P:   RASS goal: -1 Sedate for tube tolerance NS/Neu following   FAMILY  - Updates:   - Inter-disciplinary family meet or Palliative Care meeting due by:  day 7    TODAY'S SUMMARY:  81 yo WM with  evacuation of left frontal SDH 8/22 and had done well post op until 8/27 when he developed worsening dysarthria, frequent seizures, suspected aspirations and poor airway protection. PCCM was consulted and urgent intubation intubation  Performed. PCCM will assist  In his care.   Richardson Landry Minor ACNP Maryanna Shape PCCM Pager [phone] till 3 pm If no answer page [phone] 12/13/2014, 10:50 AM

## 2014-12-13 NOTE — Consult Note (Addendum)
Reason for Consult: abd distension Referring Physician: Dr. Heloise Ochoa Jay Lester is an 81 y.o. male.  HPI: The pt is an 81yo wm who recently underwent craniotomy for a SDH. Today he aspirated and developed respiratory distress and had to be intubated. He now has a distended abd and xray shows diffuse dilation of small bowel. He has no history of abdominal surgery  Past Medical History  Diagnosis Date  . Skin cancer   . Hypertension   . Coronary artery disease     Multivessel, LVEF 60%  . Hyperlipidemia   . Diverticulosis 2010  . Tubular adenoma 2010  . Redundant colon 2010    Past Surgical History  Procedure Laterality Date  . Coronary artery bypass graft  1994    LIMA to LAD, SVG to first and second diagonal, SVG to OM, SVG to PDA  . Colonoscopy  10/29/2008    Dr. Gala Romney- incomplete, redundant elongated colon precluded complete exam, suboptimal prep, diffuse diverticulosis, 2 tubular adenomas  . Colonoscopy  12/09/08    Virtual Colonoscopy-normal  . Craniotomy Left 12/01/2014    Procedure: CRANIOTOMY HEMATOMA EVACUATION SUBDURAL;  Surgeon: Kevan Ny Ditty, MD;  Location: [facility];  Service: Neurosurgery;  Laterality: Left;  left     Family History  Problem Relation Age of Onset  . Colon cancer  18  . Colon cancer  52    Social History:  reports that he quit smoking about 22 years ago. He does not have any smokeless tobacco history on file. He reports that he does not drink alcohol or use illicit drugs.  Allergies: No Known Allergies  Medications: I have reviewed the patient's current medications.  Results for orders placed or performed during the hospital encounter of 12/12/2014 (from the past 48 hour(s))  Sodium     Status: Abnormal   Collection Time: 12/12/14  2:53 AM  Result Value Ref Range   Sodium 134 (L) 135 - 145 mmol/L  Sodium     Status: Abnormal   Collection Time: 12/13/14  3:18 AM  Result Value Ref Range   Sodium 133 (L) 135 - 145 mmol/L  Blood  gas, arterial     Status: Abnormal   Collection Time: 12/13/14  8:05 AM  Result Value Ref Range   FIO2 1.00    Delivery systems NON-REBREATHER OXYGEN MASK    pH, Arterial 7.277 (L) 7.350 - 7.450   pCO2 arterial 64.1 (HH) 35.0 - 45.0 mmHg    Comment: CRITICAL RESULT CALLED TO, READ BACK BY AND VERIFIED WITH: DENISE WOLFE,RN @ 0815 BY MORGAN MACON RRT,RCP ON 12/13/2014 BY    pO2, Arterial 67.7 (L) 80.0 - 100.0 mmHg   Bicarbonate 28.9 (H) 20.0 - 24.0 mEq/L   TCO2 30.9 0 - 100 mmol/L   Acid-Base Excess 2.7 (H) 0.0 - 2.0 mmol/L   O2 Saturation 87.7 %   Patient temperature 98.5    Collection site RIGHT RADIAL    Drawn by [id number]    Sample type ARTERIAL DRAW    Allens test (pass/fail) PASS PASS  Triglycerides     Status: None   Collection Time: 12/13/14 10:28 AM  Result Value Ref Range   Triglycerides 59 <150 mg/dL  I-STAT 3, arterial blood gas (G3+)     Status: Abnormal   Collection Time: 12/13/14 11:32 AM  Result Value Ref Range   pH, Arterial 7.346 (L) 7.350 - 7.450   pCO2 arterial 54.4 (H) 35.0 - 45.0 mmHg   pO2, Arterial 67.0 (L)  80.0 - 100.0 mmHg   Bicarbonate 29.8 (H) 20.0 - 24.0 mEq/L   TCO2 31 0 - 100 mmol/L   O2 Saturation 91.0 %   Acid-Base Excess 3.0 (H) 0.0 - 2.0 mmol/L   Collection site RADIAL, ALLEN'S TEST ACCEPTABLE    Drawn by RT    Sample type ARTERIAL   Phenytoin level, total     Status: None   Collection Time: 12/13/14 11:40 AM  Result Value Ref Range   Phenytoin Lvl 13.1 10.0 - 20.0 ug/mL  Urinalysis, Routine w reflex microscopic (not at [facility])     Status: Abnormal   Collection Time: 12/13/14 12:26 PM  Result Value Ref Range   Color, Urine AMBER (A) YELLOW    Comment: BIOCHEMICALS MAY BE AFFECTED BY COLOR   APPearance CLOUDY (A) CLEAR   Specific Gravity, Urine 1.025 1.005 - 1.030   pH 5.5 5.0 - 8.0   Glucose, UA 100 (A) NEGATIVE mg/dL   Hgb urine dipstick NEGATIVE NEGATIVE   Bilirubin Urine NEGATIVE NEGATIVE   Ketones, ur NEGATIVE NEGATIVE mg/dL    Protein, ur NEGATIVE NEGATIVE mg/dL   Urobilinogen, UA 0.2 0.0 - 1.0 mg/dL   Nitrite NEGATIVE NEGATIVE   Leukocytes, UA SMALL (A) NEGATIVE  Strep pneumoniae urinary antigen     Status: None   Collection Time: 12/13/14 12:26 PM  Result Value Ref Range   Strep Pneumo Urinary Antigen NEGATIVE NEGATIVE    Comment:        Infection due to S. pneumoniae cannot be absolutely ruled out since the antigen present may be below the detection limit of the test.   Urine microscopic-add on     Status: None   Collection Time: 12/13/14 12:26 PM  Result Value Ref Range   WBC, UA 3-6 <3 WBC/hpf   Urine-Other MUCOUS PRESENT     Comment: AMORPHOUS URATES/PHOSPHATES  CBC     Status: Abnormal   Collection Time: 12/13/14  2:05 PM  Result Value Ref Range   WBC 11.2 (H) 4.0 - 10.5 K/uL   RBC 3.57 (L) 4.22 - 5.81 MIL/uL   Hemoglobin 11.0 (L) 13.0 - 17.0 g/dL   HCT 32.2 (L) 39.0 - 52.0 %   MCV 90.2 78.0 - 100.0 fL   MCH 30.8 26.0 - 34.0 pg   MCHC 34.2 30.0 - 36.0 g/dL   RDW 14.2 11.5 - 15.5 %   Platelets 142 (L) 150 - 400 K/uL  Basic metabolic panel     Status: Abnormal   Collection Time: 12/13/14  2:05 PM  Result Value Ref Range   Sodium 137 135 - 145 mmol/L   Potassium 3.9 3.5 - 5.1 mmol/L   Chloride 100 (L) 101 - 111 mmol/L   CO2 30 22 - 32 mmol/L   Glucose, Bld 144 (H) 65 - 99 mg/dL   BUN 14 6 - 20 mg/dL   Creatinine, Ser 1.35 (H) 0.61 - 1.24 mg/dL   Calcium 8.4 (L) 8.9 - 10.3 mg/dL   GFR calc non Af Amer 45 (L) >60 mL/min   GFR calc Af Amer 52 (L) >60 mL/min    Comment: (NOTE) The eGFR has been calculated using the CKD EPI equation. This calculation has not been validated in all clinical situations. eGFR's persistently <60 mL/min signify possible Chronic Kidney Disease.    Anion gap 7 5 - 15  Phosphorus     Status: None   Collection Time: 12/13/14  2:05 PM  Result Value Ref Range   Phosphorus 3.3 2.5 -  4.6 mg/dL  Magnesium     Status: Abnormal   Collection Time: 12/13/14  2:05 PM   Result Value Ref Range   Magnesium 1.4 (L) 1.7 - 2.4 mg/dL    Dg Chest Port 1 View  12/13/2014   CLINICAL DATA:  Endotracheal tube and central line placement.  EXAM: PORTABLE CHEST - 1 VIEW  COMPARISON:  12/13/2014; 12/10/2014  FINDINGS: Grossly unchanged borderline enlarged cardiac silhouette and mediastinal contours with atherosclerotic plaque within the thoracic aorta. Post median sternotomy CABG. Several fractured sternotomy wires are again incidentally noted.  Interval intubation with endotracheal tube overlying the tracheal air column with tip approximately 4.5 cm above the carina. Left jugular approach central venous catheter with tip projected over the superior cavoatrial junction. Enteric tube tip and side port project below the left hemidiaphragm. No pneumothorax.  Worsening bibasilar heterogeneous opacities, right greater than left. No definite pleural effusion or evidence of edema. Unchanged bones.  IMPRESSION: 1. Support apparatus as above.  No pneumothorax. 2. Worsening bibasilar heterogeneous opacities potentially atelectasis though worrisome for aspiration. Continued attention on follow-up is recommended.   Electronically Signed   By: Sandi Mariscal M.D.   On: 12/13/2014 11:22   Dg Chest Port 1 View  12/13/2014   CLINICAL DATA:  Cough.  EXAM: PORTABLE CHEST - 1 VIEW  COMPARISON:  12/10/2014  FINDINGS: There is patchy opacity at the right lung base. Bronchopneumonia is suspected. Milder similar opacity is noted to medial lung base which may reflect additional pneumonia or be due to atelectasis.  No convincing pulmonary edema.  No pleural effusion or pneumothorax.  Stable changes from previous CABG surgery are noted. No mediastinal or hilar masses or convincing adenopathy.  IMPRESSION: 1. Since the prior exam, opacity in the lung bases, right greater the left, has increased. Right base opacity is consistent with bronchopneumonia. Left base opacity may reflect atelectasis or additional pneumonia.  No pulmonary edema.   Electronically Signed   By: Lajean Manes M.D.   On: 12/13/2014 09:44   Dg Abd Portable 1v  12/13/2014   CLINICAL DATA:  Acute distention of the abdomen.  EXAM: PORTABLE ABDOMEN - 1 VIEW  COMPARISON:  None.  FINDINGS: NG tube is in place with the tip in the peripyloric region. There is gaseous distention of small bowel loops concerning for small bowel obstruction. No visible free air on this supine image. No organomegaly or suspicious calcification.  IMPRESSION: Dilated small bowel loops diffusely concerning for small bowel obstruction.   Electronically Signed   By: Rolm Baptise M.D.   On: 12/13/2014 13:57    Review of Systems  Constitutional: Negative.   HENT: Negative.   Eyes: Negative.   Respiratory: Positive for shortness of breath.   Cardiovascular: Negative.   Gastrointestinal: Negative.   Genitourinary: Negative.   Musculoskeletal: Negative.   Skin: Negative.   Neurological: Negative.   Endo/Heme/Allergies: Negative.   Psychiatric/Behavioral: Negative.    Blood pressure 101/51, pulse 85, temperature 98.5 F (36.9 C), temperature source Oral, resp. rate 18, height _0  (1.702 m), weight 64.3 kg (141 lb 12.1 oz), SpO2 96 %. Physical Exam  Constitutional: He appears well-developed and well-nourished.  HENT:  Craniotomy is healing well  Eyes: Conjunctivae and EOM are normal. Pupils are equal, round, and reactive to light.  Neck: Normal range of motion. Neck supple.  Cardiovascular: Normal rate, regular rhythm and normal heart sounds.   Respiratory: Effort normal and breath sounds normal.  On vent  GI: Soft. Bowel sounds are  normal. There is no tenderness.  Abdomen is soft with mild distension and minimal tenderness. There are bowel sounds  Musculoskeletal: Normal range of motion.  Neurological: He is alert.  On vent  Skin: Skin is warm and dry.  Psychiatric: He has a normal mood and affect. His behavior is normal.    Assessment/Plan: The pt likely  has an ileus from the aspiration and may have some air in small bowel from the intubation. I would rest his bowel and decompress with ng tube until bowel function returns  TOTH III,PAUL S 12/13/2014, 7:48 PM

## 2014-12-13 NOTE — Progress Notes (Signed)
Pt vomited last night and has been having an increasing cough and spitting up brown colored bits of food/mucus.  The pts respirations have increased and his oxy sat has decreased.  Neuro MD on call notified and cxr ordered for possible aspiration.  Will continue to monitor.

## 2014-12-13 NOTE — Progress Notes (Signed)
Subjective: Respiratory failure overnight  Exam: Filed Vitals:   12/13/14 0815  BP: 151/83  Pulse: 101  Temp:   Resp: 27   Gen: In bed, intubated Resp: ventilated.  Abd: soft, nt  Neuro: Intubated minutes prior to my arrival, however he was following commands per nursing just prior to intubation.    Impression: 81 year old male admitted with subdural hematoma with subsequent worsening aphasia and dysarthria due to frequent seizures. He continued to have seizures despite keppra and dilantin and therefore vimpat was added. He has had continued improvement since the addition of phenytoin an no further seizures following the addition of vimpat.    Recommendations: 1)dilantin level.  2) continue vimpat, keppra.   Roland Rack, MD [facility] [phone]  If 7pm- 7am, please page neurology on call as listed in [place].

## 2014-12-13 NOTE — Progress Notes (Signed)
Patient ID: Jay Lester, male   DOB: 1924-03-01, 81 y.o.   MRN: [id number] Subjective:  I was called while I was doing emergency surgery and told the patient had respiratory distress. The patient has been transferred to 3100. He is alert and cooperative. He is tachypnic and dyspneic.  Objective: Vital signs in last 24 hours: Temp:  [97.3 F (36.3 C)-98.6 F (37 C)] 97.5 F (36.4 C) (08/27 0745) Pulse Rate:  [75-101] 101 (08/27 0815) Resp:  [16-27] 27 (08/27 0815) BP: (132-159)/(60-87) 151/83 mmHg (08/27 0815) SpO2:  [87 %-98 %] 93 % (08/27 0815) FiO2 (%):  [15 %-100 %] 100 % (08/27 0745)  Intake/Output from previous day: 08/26 0701 - 08/27 0700 In: 1290.5 [P.O.:200; I.V.:820.5; IV Piggyback:270] Out: 375 [Urine:375] Intake/Output this shift:    Physical exam the patient is alert. He follows commands in all 4 extremities. He has some mild right hemiparesis.  Lab Results:  Recent Labs  12/10/14 1021  WBC 13.4*  HGB 11.0*  HCT 32.0*  PLT 111*   BMET  Recent Labs  12/10/14 1021  12/12/14 0253 12/13/14 0318  NA 135  136  < > 134* 133*  K 3.7  --   --   --   CL 102  --   --   --   CO2 27  --   --   --   GLUCOSE 109*  --   --   --   BUN 10  --   --   --   CREATININE 1.02  --   --   --   CALCIUM 8.5*  --   --   --   < > = values in this interval not displayed.  Studies/Results: No results found.  Assessment/Plan: Respiratory failure: Critical care medicine is seeing the patient presently. I spoke with the patient's wife and daughter and updated them on his condition. I have told him that he will need to be intubated. There are in agreement.  Status post craniotomy: Neurologically he seems to be doing well.  LOS: 7 days     Alonso Gapinski D 12/13/2014, 8:49 AM

## 2014-12-13 NOTE — Progress Notes (Signed)
ANTIBIOTIC CONSULT NOTE - INITIAL  Pharmacy Consult for Vancomycin, Zosyn  Indication: R/o Aspiration pneumonia   No Known Allergies  Patient Measurements: Height: 5\' 7"  (170.2 cm) Weight: 141 lb 12.1 oz (64.3 kg) IBW/kg (Calculated) : 66.1  Vital Signs: Temp: 97.5 F (36.4 C) (08/27 0745) Temp Source: Axillary (08/27 0745) BP: 83/48 mmHg (08/27 1115) Pulse Rate: 89 (08/27 1115) Intake/Output from previous day: 08/26 0701 - 08/27 0700 In: 1290.5 [P.O.:200; I.V.:820.5; IV Piggyback:270] Out: 375 [Urine:375] Intake/Output from this shift: Total I/O In: 4.4 [I.V.:4.4] Out: -   Labs: No results for input(s): WBC, HGB, PLT, LABCREA, CREATININE in the last 72 hours. Estimated Creatinine Clearance: 44.7 mL/min (by C-G formula based on Cr of 1.02). No results for input(s): VANCOTROUGH, VANCOPEAK, VANCORANDOM, GENTTROUGH, GENTPEAK, GENTRANDOM, TOBRATROUGH, TOBRAPEAK, TOBRARND, AMIKACINPEAK, AMIKACINTROU, AMIKACIN in the last 72 hours.   Microbiology: Recent Results (from the past 720 hour(s))  MRSA PCR Screening     Status: None   Collection Time: 12/07/14  4:32 AM  Result Value Ref Range Status   MRSA by PCR NEGATIVE NEGATIVE Final    Comment:        The GeneXpert MRSA Assay (FDA approved for NASAL specimens only), is one component of a comprehensive MRSA colonization surveillance program. It is not intended to diagnose MRSA infection nor to guide or monitor treatment for MRSA infections.     Medical History: Past Medical History  Diagnosis Date  . Skin cancer   . Hypertension   . Coronary artery disease     Multivessel, LVEF 60%  . Hyperlipidemia   . Diverticulosis 2010  . Tubular adenoma 2010  . Redundant colon 2010    Medications:  Prescriptions prior to admission  Medication Sig Dispense Refill Last Dose  . albuterol (PROAIR HFA) 108 (90 BASE) MCG/ACT inhaler Inhale 2 puffs into the lungs every 6 (six) hours as needed. Shortness of breath   unknown  .  alfuzosin (UROXATRAL) 10 MG 24 hr tablet Take 10 mg by mouth daily.   11/23/2014 at Unknown time  . amLODipine (NORVASC) 10 MG tablet TAKE 1 TABLET BY MOUTH ONCE DAILY. 30 tablet 11 11/25/2014 at Unknown time  . LORazepam (ATIVAN) 0.5 MG tablet Take 0.5 mg by mouth at bedtime as needed for anxiety or sleep.    12/05/2014 at Unknown time  . losartan (COZAAR) 100 MG tablet TAKE 1 TABLET BY MOUTH ONCE DAILY. 30 tablet 3 12/16/2014 at Unknown time  . metoprolol succinate (TOPROL-XL) 25 MG 24 hr tablet TAKE 1/2 TABLET BY MOUTH ONCE DAILY. 15 tablet 11 11/21/2014 at [place]  . nitroGLYCERIN (NITROSTAT) 0.4 MG SL tablet Place 1 tablet (0.4 mg total) under the tongue every 5 (five) minutes as needed. 100 tablet 3 unknown  . Omega-3 Fatty Acids (FISH OIL BURP-LESS PO) Take 1 capsule by mouth every morning.   12/08/2014 at Unknown time  . simvastatin (ZOCOR) 10 MG tablet TAKE 1 TABLET BY MOUTH AT BEDTIME. 30 tablet 11 12/05/2014 at Unknown time   Assessment: 30 YOM with evacuation of left frontal SDH on 8/22 developed worsening dysarthria, frequent seizures, and suspected aspirations. He was intubated in the ED. Pharmacy consulted to start IV Vancomycin and Zosyn for empiric coverage of aspiration pneumonia.   WBC elevated at 13.4, CrCl ~ 44 mL/min.   8/27 BCx2>>  8/27 Trach asp>>   Goal of Therapy:  Vancomycin trough level 15-20 mcg/ml  Plan:  -Start Zosyn 3.375 gm IV Q 8 hours -Give Vancomycin 1250 mg IV  load followed by Vancomycin 500 mg IV Q 12 hours -Monitor CBC, renal fx, cultures and clinical progress -VT at [place], PharmD., BCPS Clinical Pharmacist Pager [phone]

## 2014-12-13 NOTE — Progress Notes (Signed)
eLink Physician-Brief Progress Note Patient Name: Margues Filippini DOB: Feb 20, 1924 MRN: [id number]   Date of Service  12/13/2014  HPI/Events of Note  Mechanically ventilated patient requires stress ulcer prophylaxis.   eICU Interventions  Will order: 1. Protonix 40 mg IV now and Q day.     Intervention Category Intermediate Interventions: Best-practice therapies (e.g. DVT, beta blocker, etc.)  Sabreen Kitchen Eugene 12/13/2014, 4:31 PM

## 2014-12-13 NOTE — Progress Notes (Signed)
eLink Physician-Brief Progress Note Patient Name: Jay Lester DOB: [birth year] MRN: [id number]   Date of Service  12/13/2014  HPI/Events of Note  K+ = 3.9, Mg++ = 1.4 and Creatinine = 1.35.   eICU Interventions  Will replete Mg++ and K+.      Intervention Category Intermediate Interventions: Electrolyte abnormality - evaluation and management  Lysle Dingwall 12/13/2014, 4:39 PM

## 2014-12-13 NOTE — Procedures (Signed)
Intubation Procedure Note Jay Lester [id number] 02-01-1924  Procedure: Intubation Indications: Respiratory insufficiency  Variable  0 Points 1 Point 2 Points Total  Heart rate per minute  <90 beats 90-109 beats >110 beats 1  Respiratory  Rate per minute < 18 breaths 19-30 breaths  >30 breaths 2  Restlessness; nonpurposeful movements None  occas slight movement Frequent movement 1  Paradoxical breathing pattern: None  Present 2  Accessory muscle use: rise in clavicle during inspiration None Slight rise Pronounced rise 2  Grunting at end-expiration: guttural sound None  Present 2  Nasal flaring: involuntary movement of nares None  Present 2  Look of fear None  Eyes wide 2  Overall total out of 16    13    Respiratory Distress Observation Scale Journal of Palliative Medicine Vol. 13, Number 3, 2010 Campbell et al.  1   Procedure Details Consent: Risks of procedure as well as the alternatives and risks of each were explained to the (patient/caregiver).  Consent for procedure obtained. Time Out: Verified patient identification, verified procedure, site/side was marked, verified correct patient position, special equipment/implants available, medications/allergies/relevent history reviewed, required imaging and test results available.  Performed  Maximum sterile technique was used including cap, gloves, gown, hand hygiene, mask and sheet.  MAC  SEdatio - fent 116mcg, versed 2mg  and etomidate 20mg .  Glidescope used - easy intubation with MAc 4 blade. Secured at 23cm at lip  Evaluation Hemodynamic Status: BP stable throughout; O2 sats: stable throughout Patient's Current Condition: stable Complications: No apparent complications Patient did tolerate procedure well. Chest X-ray ordered to verify placement.  CXR: pending.   Kvon Mcilhenny 12/13/2014

## 2014-12-13 NOTE — Progress Notes (Signed)
eLink Physician-Brief Progress Note Patient Name: Jay Lester DOB: 10-Sep-1923 MRN: [id number]   Date of Service  12/13/2014  HPI/Events of Note  Surgeon wants OGT pulled back. Did not specify how much to pull back. Review of KUB reveals that the OGT can be pulled back by 10 cm and re-secured.   eICU Interventions  Will order: 1. Pull OGT back 10 cm and re-secure tube. 2. KUB to assess OGT placement.     Intervention Category Minor Interventions: Routine modifications to care plan (e.g. PRN medications for pain, fever)  Sommer,Steven Eugene 12/13/2014, 8:49 PM

## 2014-12-13 NOTE — Progress Notes (Signed)
Rocuronium 50 mg wasted in sink. Kasandra Knudsen, RN as witness.

## 2014-12-14 ENCOUNTER — Inpatient Hospital Stay (HOSPITAL_COMMUNITY): Payer: Medicare Other

## 2014-12-14 LAB — LACTIC ACID, PLASMA: LACTIC ACID, VENOUS: 1.6 mmol/L (ref 0.5–2.0)

## 2014-12-14 LAB — CBC
HCT: 28.9 % — ABNORMAL LOW (ref 39.0–52.0)
Hemoglobin: 9.6 g/dL — ABNORMAL LOW (ref 13.0–17.0)
MCH: 30.6 pg (ref 26.0–34.0)
MCHC: 33.2 g/dL (ref 30.0–36.0)
MCV: 92 fL (ref 78.0–100.0)
PLATELETS: 129 10*3/uL — AB (ref 150–400)
RBC: 3.14 MIL/uL — AB (ref 4.22–5.81)
RDW: 14.7 % (ref 11.5–15.5)
WBC: 14.7 10*3/uL — ABNORMAL HIGH (ref 4.0–10.5)

## 2014-12-14 LAB — PHOSPHORUS: PHOSPHORUS: 3.9 mg/dL (ref 2.5–4.6)

## 2014-12-14 LAB — BASIC METABOLIC PANEL
Anion gap: 7 (ref 5–15)
BUN: 16 mg/dL (ref 6–20)
CALCIUM: 7.9 mg/dL — AB (ref 8.9–10.3)
CO2: 25 mmol/L (ref 22–32)
CREATININE: 1.39 mg/dL — AB (ref 0.61–1.24)
Chloride: 105 mmol/L (ref 101–111)
GFR calc Af Amer: 50 mL/min — ABNORMAL LOW (ref >60)
GFR, EST NON AFRICAN AMERICAN: 43 mL/min — AB (ref >60)
Glucose, Bld: 131 mg/dL — ABNORMAL HIGH (ref 65–99)
POTASSIUM: 4.3 mmol/L (ref 3.5–5.1)
SODIUM: 137 mmol/L (ref 135–145)

## 2014-12-14 LAB — MAGNESIUM: Magnesium: 1.9 mg/dL (ref 1.7–2.4)

## 2014-12-14 LAB — TROPONIN I: Troponin I: <0.03 ng/mL (ref <0.031)

## 2014-12-14 MED ORDER — DIATRIZOATE MEGLUMINE & SODIUM 66-10 % PO SOLN
ORAL | Status: AC
  Filled 2014-12-14: qty 90

## 2014-12-14 MED ORDER — VANCOMYCIN HCL IN DEXTROSE 1-5 GM/200ML-% IV SOLN
1000.0000 mg | INTRAVENOUS | Status: DC
  Administered 2014-12-14 – 2014-12-18 (×5): 1000 mg via INTRAVENOUS
  Filled 2014-12-14 (×5): qty 200

## 2014-12-14 MED ORDER — BISACODYL 10 MG RE SUPP
10.0000 mg | Freq: Every day | RECTAL | Status: DC | PRN
  Administered 2014-12-14: 10 mg via RECTAL
  Filled 2014-12-14 (×2): qty 1

## 2014-12-14 MED ORDER — DIATRIZOATE MEGLUMINE & SODIUM 66-10 % PO SOLN
90.0000 mL | Freq: Once | ORAL | Status: AC
  Administered 2014-12-14: 90 mL via NASOGASTRIC

## 2014-12-14 NOTE — Progress Notes (Addendum)
6 Days Post-Op  Subjective: Pt alert.  No overnight events.    Objective: Vital signs in last 24 hours: Temp:  [98.5 F (36.9 C)-100.4 F (38 C)] 100.4 F (38 C) (08/28 0759) Pulse Rate:  [79-106] 98 (08/28 1000) Resp:  [15-22] 21 (08/28 1000) BP: (80-130)/(44-108) 124/61 mmHg (08/28 1000) SpO2:  [95 %-100 %] 99 % (08/28 1000) FiO2 (%):  [40 %-100 %] 40 % (08/28 0800) Last BM Date: 12/07/14  Intake/Output from previous day: 08/27 0701 - 08/28 0700 In: 4021.9 [I.V.:1534.4; IV Piggyback:2487.5] Out: 1470 [Urine:720; Emesis/NG output:750] Intake/Output this shift: Total I/O In: 410 [I.V.:300; IV Piggyback:110] Out: 195 [Urine:115; Emesis/NG output:80]  General appearance: alert, cooperative and mild distress Resp: breathing comfortably on ventilator GI: soft, mildly distended, non tender  Lab Results:   Recent Labs  12/13/14 1405 12/14/14 0233  WBC 11.2* 14.7*  HGB 11.0* 9.6*  HCT 32.2* 28.9*  PLT 142* 129*   BMET  Recent Labs  12/13/14 1405 12/14/14 0233  NA 137 137  K 3.9 4.3  CL 100* 105  CO2 30 25  GLUCOSE 144* 131*  BUN 14 16  CREATININE 1.35* 1.39*  CALCIUM 8.4* 7.9*   PT/INR No results for input(s): LABPROT, INR in the last 72 hours. ABG  Recent Labs  12/13/14 0805 12/13/14 1132  PHART 7.277* 7.346*  HCO3 28.9* 29.8*    Studies/Results: Dg Chest Port 1 View  12/14/2014   CLINICAL DATA:  Respiratory failure.  EXAM: PORTABLE CHEST - 1 VIEW  COMPARISON:  1 day prior  FINDINGS: Endotracheal tube terminates 3.8 cm above carina. Left internal jugular line tip at mid SVC. Nasogastric tube extends beyond the inferior aspect of the film. Normal heart size. Probable small left pleural effusion. No pneumothorax. Mild pulmonary venous congestion. Infrahilar and basilar airspace disease bilaterally. Similar, given differences in technique.  IMPRESSION: No change in lower lobe predominant airspace disease, suspicious for infection or aspiration.  Developing  pulmonary venous congestion with a probable small left pleural effusion.   Electronically Signed   By: Abigail Miyamoto M.D.   On: 12/14/2014 09:29   Dg Chest Port 1 View  12/13/2014   CLINICAL DATA:  Endotracheal tube and central line placement.  EXAM: PORTABLE CHEST - 1 VIEW  COMPARISON:  12/13/2014; 12/10/2014  FINDINGS: Grossly unchanged borderline enlarged cardiac silhouette and mediastinal contours with atherosclerotic plaque within the thoracic aorta. Post median sternotomy CABG. Several fractured sternotomy wires are again incidentally noted.  Interval intubation with endotracheal tube overlying the tracheal air column with tip approximately 4.5 cm above the carina. Left jugular approach central venous catheter with tip projected over the superior cavoatrial junction. Enteric tube tip and side port project below the left hemidiaphragm. No pneumothorax.  Worsening bibasilar heterogeneous opacities, right greater than left. No definite pleural effusion or evidence of edema. Unchanged bones.  IMPRESSION: 1. Support apparatus as above.  No pneumothorax. 2. Worsening bibasilar heterogeneous opacities potentially atelectasis though worrisome for aspiration. Continued attention on follow-up is recommended.   Electronically Signed   By: Sandi Mariscal M.D.   On: 12/13/2014 11:22   Dg Chest Port 1 View  12/13/2014   CLINICAL DATA:  Cough.  EXAM: PORTABLE CHEST - 1 VIEW  COMPARISON:  12/10/2014  FINDINGS: There is patchy opacity at the right lung base. Bronchopneumonia is suspected. Milder similar opacity is noted to medial lung base which may reflect additional pneumonia or be due to atelectasis.  No convincing pulmonary edema.  No pleural effusion or  pneumothorax.  Stable changes from previous CABG surgery are noted. No mediastinal or hilar masses or convincing adenopathy.  IMPRESSION: 1. Since the prior exam, opacity in the lung bases, right greater the left, has increased. Right base opacity is consistent with  bronchopneumonia. Left base opacity may reflect atelectasis or additional pneumonia. No pulmonary edema.   Electronically Signed   By: Lajean Manes M.D.   On: 12/13/2014 09:44   Dg Abd Portable 1v  12/13/2014   CLINICAL DATA:  Orogastric tube placement.  EXAM: PORTABLE ABDOMEN - 1 VIEW  COMPARISON:  12/13/2014  FINDINGS: Enteric tube tip is located in the left upper quadrant consistent with location in the upper stomach. Persistent gas-filled distended small bowel loops consistent with small bowel obstruction. No radiopaque stones. Degenerative changes in the spine.  IMPRESSION: Enteric tube tip is in the left upper quadrant consistent with location in the upper stomach. Persistent gas-filled dilated small bowel consistent with obstruction.   Electronically Signed   By: Lucienne Capers M.D.   On: 12/13/2014 21:39   Dg Abd Portable 1v  12/13/2014   CLINICAL DATA:  Acute distention of the abdomen.  EXAM: PORTABLE ABDOMEN - 1 VIEW  COMPARISON:  None.  FINDINGS: NG tube is in place with the tip in the peripyloric region. There is gaseous distention of small bowel loops concerning for small bowel obstruction. No visible free air on this supine image. No organomegaly or suspicious calcification.  IMPRESSION: Dilated small bowel loops diffusely concerning for small bowel obstruction.   Electronically Signed   By: Rolm Baptise M.D.   On: 12/13/2014 13:57    Anti-infectives: Anti-infectives    Start     Dose/Rate Route Frequency Ordered Stop   12/13/14 1200  piperacillin-tazobactam (ZOSYN) IVPB 3.375 g     3.375 g 12.5 mL/hr over 240 Minutes Intravenous Every 8 hours 12/13/14 1119     12/13/14 1130  vancomycin (VANCOCIN) 1,250 mg in sodium chloride 0.9 % 250 mL IVPB     1,250 mg 166.7 mL/hr over 90 Minutes Intravenous  Once 12/13/14 1120 12/13/14 1451   11/22/2014 2300  ceFAZolin (ANCEF) IVPB 2 g/50 mL premix     2 g 100 mL/hr over 30 Minutes Intravenous 3 times per day 11/22/2014 1823 12/09/14 0534    12/05/2014 1830  ceFAZolin (ANCEF) NICU IV syringe 100 mg/mL  Status:  Discontinued     2,000 mg 240 mL/hr over 5 Minutes Intravenous Every 8 hours 12/12/2014 1816 11/24/2014 1823   11/19/2014 1530  bacitracin 50,000 Units in sodium chloride irrigation 0.9 % 500 mL irrigation  Status:  Discontinued       As needed 12/16/2014 1530 12/01/2014 1644   12/11/2014 1500  cefTRIAXone (ROCEPHIN) 1 g in dextrose 5 % 50 mL IVPB     1 g 100 mL/hr over 30 Minutes Intravenous To Neuro OR-Station #32 12/17/2014 1458 12/06/2014 1507   11/20/2014 2300  [MAR Hold]  ceFAZolin (ANCEF) IVPB 2 g/50 mL premix     (MAR Hold since 11/25/2014 1316)  Comments:  Send with patient to or   2 g 100 mL/hr over 30 Minutes Intravenous  Once 11/17/2014 2214 11/24/2014 1451      Assessment/Plan: s/p Procedure(s) with comments: CRANIOTOMY HEMATOMA EVACUATION SUBDURAL (Left) - left  ileus  Do small bowel protocol.  No reason for patient to have obstruction and ileus likely secondary to crani, narcotics, and aspiration. Leave NPO.  OK to give small dose contrast and if able, OK to extubate later  this afternoon.  Can hold on NGT placement (OG tube in place now) until we see what happens with contrast and suppository that patient is getting.    LOS: 8 days    [facility] 12/14/2014

## 2014-12-14 NOTE — Progress Notes (Signed)
PULMONARY / CRITICAL CARE MEDICINE   Name: Jay Lester MRN: [id number] DOB: 1923-05-31    ADMISSION DATE:  11/30/2014 CONSULTATION DATE:  8/27  REFERRING MD :  Neuro  CHIEF COMPLAINT:  Resp distress  INITIAL PRESENTATION: SDH  STUDIES:    SIGNIFICANT EVENTS: 8/27 intubated   HISTORY OF PRESENT ILLNESS:   81 yo WM with evacuation of left frontal SDH 8/22 and had done well post op until 8/27 when he developed worsening dysarthria, frequent seizures, suspected aspirations and poor airway protection. PCCM was consulted and urgent intubation intubation  Performed. PCCM will assist  In his care   SUBJECTIVE:   Awake and alert  VITAL SIGNS: Temp:  [98.5 F (36.9 C)-100.4 F (38 C)] 100.4 F (38 C) (08/28 0759) Pulse Rate:  [79-106] 94 (08/28 0900) Resp:  [15-22] 18 (08/28 0900) BP: (80-130)/(44-108) 122/60 mmHg (08/28 0900) SpO2:  [95 %-100 %] 98 % (08/28 0900) FiO2 (%):  [40 %-100 %] 40 % (08/28 0800) HEMODYNAMICS:   VENTILATOR SETTINGS: Vent Mode:  [-] PSV;CPAP FiO2 (%):  [40 %-100 %] 40 % Set Rate:  [16 bmp] 16 bmp Vt Set:  [530 mL] 530 mL PEEP:  [5 cmH20] 5 cmH20 Pressure Support:  [5 cmH20] 5 cmH20 Plateau Pressure:  [19 cmH20-22 cmH20] 20 cmH20 INTAKE / OUTPUT:  Intake/Output Summary (Last 24 hours) at 12/14/14 1002 Last data filed at 12/14/14 0900  Gross per 24 hour  Intake 4221.85 ml  Output   1515 ml  Net 2706.85 ml    PHYSICAL EXAMINATION: General: EWM awake on vent Neuro:  Intact, awake and alert  HEENT:  OTT-> vent OGT Cardiovascular:  HSR RRR Lungs:  Coarse rhonchi bilat Abdomen:  Soft +bs but faint Musculoskeletal:  intact Skin: L crani incision noted   LABS:  CBC  Recent Labs Lab 12/10/14 1021 12/13/14 1405 12/14/14 0233  WBC 13.4* 11.2* 14.7*  HGB 11.0* 11.0* 9.6*  HCT 32.0* 32.2* 28.9*  PLT 111* 142* 129*   Coag's No results for input(s): APTT, INR in the last 168 hours. BMET  Recent Labs Lab 12/10/14 1021   12/13/14 0318 12/13/14 1405 12/14/14 0233  NA 135  136  < > 133* 137 137  K 3.7  --   --  3.9 4.3  CL 102  --   --  100* 105  CO2 27  --   --  30 25  BUN 10  --   --  14 16  CREATININE 1.02  --   --  1.35* 1.39*  GLUCOSE 109*  --   --  144* 131*  < > = values in this interval not displayed. Electrolytes  Recent Labs Lab 12/10/14 1021 12/13/14 1405 12/14/14 0233  CALCIUM 8.5* 8.4* 7.9*  MG  --  1.4* 1.9  PHOS  --  3.3 3.9   Sepsis Markers  Recent Labs Lab 12/13/14 2305  LATICACIDVEN 1.6   ABG  Recent Labs Lab 12/13/14 0805 12/13/14 1132  PHART 7.277* 7.346*  PCO2ART 64.1* 54.4*  PO2ART 67.7* 67.0*   Liver Enzymes No results for input(s): AST, ALT, ALKPHOS, BILITOT, ALBUMIN in the last 168 hours. Cardiac Enzymes  Recent Labs Lab 12/14/14 0233  TROPONINI <0.03   Glucose No results for input(s): GLUCAP in the last 168 hours.  Imaging Dg Chest Port 1 View  12/14/2014   CLINICAL DATA:  Respiratory failure.  EXAM: PORTABLE CHEST - 1 VIEW  COMPARISON:  1 day prior  FINDINGS: Endotracheal tube terminates 3.8 cm above  carina. Left internal jugular line tip at mid SVC. Nasogastric tube extends beyond the inferior aspect of the film. Normal heart size. Probable small left pleural effusion. No pneumothorax. Mild pulmonary venous congestion. Infrahilar and basilar airspace disease bilaterally. Similar, given differences in technique.  IMPRESSION: No change in lower lobe predominant airspace disease, suspicious for infection or aspiration.  Developing pulmonary venous congestion with a probable small left pleural effusion.   Electronically Signed   By: Abigail Miyamoto M.D.   On: 12/14/2014 09:29   Dg Chest Port 1 View  12/13/2014   CLINICAL DATA:  Endotracheal tube and central line placement.  EXAM: PORTABLE CHEST - 1 VIEW  COMPARISON:  12/13/2014; 12/10/2014  FINDINGS: Grossly unchanged borderline enlarged cardiac silhouette and mediastinal contours with atherosclerotic  plaque within the thoracic aorta. Post median sternotomy CABG. Several fractured sternotomy wires are again incidentally noted.  Interval intubation with endotracheal tube overlying the tracheal air column with tip approximately 4.5 cm above the carina. Left jugular approach central venous catheter with tip projected over the superior cavoatrial junction. Enteric tube tip and side port project below the left hemidiaphragm. No pneumothorax.  Worsening bibasilar heterogeneous opacities, right greater than left. No definite pleural effusion or evidence of edema. Unchanged bones.  IMPRESSION: 1. Support apparatus as above.  No pneumothorax. 2. Worsening bibasilar heterogeneous opacities potentially atelectasis though worrisome for aspiration. Continued attention on follow-up is recommended.   Electronically Signed   By: Sandi Mariscal M.D.   On: 12/13/2014 11:22   Dg Abd Portable 1v  12/13/2014   CLINICAL DATA:  Orogastric tube placement.  EXAM: PORTABLE ABDOMEN - 1 VIEW  COMPARISON:  12/13/2014  FINDINGS: Enteric tube tip is located in the left upper quadrant consistent with location in the upper stomach. Persistent gas-filled distended small bowel loops consistent with small bowel obstruction. No radiopaque stones. Degenerative changes in the spine.  IMPRESSION: Enteric tube tip is in the left upper quadrant consistent with location in the upper stomach. Persistent gas-filled dilated small bowel consistent with obstruction.   Electronically Signed   By: Lucienne Capers M.D.   On: 12/13/2014 21:39   Dg Abd Portable 1v  12/13/2014   CLINICAL DATA:  Acute distention of the abdomen.  EXAM: PORTABLE ABDOMEN - 1 VIEW  COMPARISON:  None.  FINDINGS: NG tube is in place with the tip in the peripyloric region. There is gaseous distention of small bowel loops concerning for small bowel obstruction. No visible free air on this supine image. No organomegaly or suspicious calcification.  IMPRESSION: Dilated small bowel loops  diffusely concerning for small bowel obstruction.   Electronically Signed   By: Rolm Baptise M.D.   On: 12/13/2014 13:57     ASSESSMENT / PLAN:  PULMONARY OETT 8/27>> A: VDRF secondary to aspiration  and PNA P:   Will extubate once CCS has completed Gastrogaffin  Continue abx Consider diuresis in future   CARDIOVASCULAR CVL left I J cvl 8/27>> A: No acute issue P:    RENAL A:   No acute issue P:    GASTROINTESTINAL A:   Malnourished Ileus P:   We hold extubation due to interventions per CCS. Will check wirh CCS concerning OGT removal Rectal supp for bm May need NGT if ileus not improved.   HEMATOLOGIC A:  No acute issue P:    INFECTIOUS A:   Suspected aspiration P:   BCx2  8/27>> UC8/27>> Sputum 8/27>> Abx: 8/27 vanc>> 8/27 pip-tazo>>  ENDOCRINE A:   No  acute issue  P:     NEUROLOGIC A:   L sdh post evac 8/22 with worsening mental status 8/27 8/28 Wide awake P:   RASS goal: `1 Stop sedation NS/Neu following   FAMILY  - Updates:   - Inter-disciplinary family meet or Palliative Care meeting due by:  day 7    TODAY'S SUMMARY:  81 yo WM with evacuation of left frontal SDH 8/22 and had done well post op until 8/27 when he developed worsening dysarthria, frequent seizures, suspected aspirations and poor airway protection. PCCM was consulted and urgent intubation intubation  Performed. PCCM will assist  In his care. He is now wide awake and we will extubate.   Richardson Landry Minor ACNP Maryanna Shape PCCM Pager [phone] till 3 pm If no answer page [phone] 12/14/2014, 10:02 AM

## 2014-12-14 NOTE — Progress Notes (Signed)
ANTIBIOTIC CONSULT NOTE - Follow Up  Pharmacy Consult for Vancomycin, Zosyn  Indication: R/o Aspiration pneumonia   No Known Allergies  Patient Measurements: Height: 5\' 7"  (170.2 cm) Weight: 141 lb 12.1 oz (64.3 kg) IBW/kg (Calculated) : 66.1  Vital Signs: Temp: 100.4 F (38 C) (08/28 0759) Temp Source: Axillary (08/28 0759) BP: 124/61 mmHg (08/28 1000) Pulse Rate: 98 (08/28 1000) Intake/Output from previous day: 08/27 0701 - 08/28 0700 In: 4021.9 [I.V.:1534.4; IV Piggyback:2487.5] Out: 1324 [Urine:720; Emesis/NG output:750] Intake/Output from this shift: Total I/O In: 410 [I.V.:300; IV Piggyback:110] Out: 195 [Urine:115; Emesis/NG output:80]  Labs:  Recent Labs  12/13/14 1405 12/14/14 0233  WBC 11.2* 14.7*  HGB 11.0* 9.6*  PLT 142* 129*  CREATININE 1.35* 1.39*   Estimated Creatinine Clearance: 32.8 mL/min (by C-G formula based on Cr of 1.39). No results for input(s): VANCOTROUGH, VANCOPEAK, VANCORANDOM, GENTTROUGH, GENTPEAK, GENTRANDOM, TOBRATROUGH, TOBRAPEAK, TOBRARND, AMIKACINPEAK, AMIKACINTROU, AMIKACIN in the last 72 hours.   Microbiology: Recent Results (from the past 720 hour(s))  MRSA PCR Screening     Status: None   Collection Time: 12/07/14  4:32 AM  Result Value Ref Range Status   MRSA by PCR NEGATIVE NEGATIVE Final    Comment:        The GeneXpert MRSA Assay (FDA approved for NASAL specimens only), is one component of a comprehensive MRSA colonization surveillance program. It is not intended to diagnose MRSA infection nor to guide or monitor treatment for MRSA infections.   Culture, respiratory (NON-Expectorated)     Status: None (Preliminary result)   Collection Time: 12/13/14  2:29 PM  Result Value Ref Range Status   Specimen Description TRACHEAL ASPIRATE  Final   Special Requests Normal  Final   Gram Stain PENDING  Incomplete   Culture   Final    Culture reincubated for better growth Performed at [facility]    Report Status  PENDING  Incomplete    Medical History: Past Medical History  Diagnosis Date  . Skin cancer   . Hypertension   . Coronary artery disease     Multivessel, LVEF 60%  . Hyperlipidemia   . Diverticulosis 2010  . Tubular adenoma 2010  . Redundant colon 2010    Medications:  Prescriptions prior to admission  Medication Sig Dispense Refill Last Dose  . albuterol (PROAIR HFA) 108 (90 BASE) MCG/ACT inhaler Inhale 2 puffs into the lungs every 6 (six) hours as needed. Shortness of breath   unknown  . alfuzosin (UROXATRAL) 10 MG 24 hr tablet Take 10 mg by mouth daily.   12/04/2014 at Unknown time  . amLODipine (NORVASC) 10 MG tablet TAKE 1 TABLET BY MOUTH ONCE DAILY. 30 tablet 11 12/17/2014 at Unknown time  . LORazepam (ATIVAN) 0.5 MG tablet Take 0.5 mg by mouth at bedtime as needed for anxiety or sleep.    12/05/2014 at Unknown time  . losartan (COZAAR) 100 MG tablet TAKE 1 TABLET BY MOUTH ONCE DAILY. 30 tablet 3 11/27/2014 at Unknown time  . metoprolol succinate (TOPROL-XL) 25 MG 24 hr tablet TAKE 1/2 TABLET BY MOUTH ONCE DAILY. 15 tablet 11 12/03/2014 at [place]  . nitroGLYCERIN (NITROSTAT) 0.4 MG SL tablet Place 1 tablet (0.4 mg total) under the tongue every 5 (five) minutes as needed. 100 tablet 3 unknown  . Omega-3 Fatty Acids (FISH OIL BURP-LESS PO) Take 1 capsule by mouth every morning.   11/17/2014 at Unknown time  . simvastatin (ZOCOR) 10 MG tablet TAKE 1 TABLET BY MOUTH AT BEDTIME.  30 tablet 11 12/05/2014 at Unknown time   Assessment: 87 YOM with evacuation of left frontal SDH on 8/22 developed worsening dysarthria, frequent seizures, and suspected aspirations. He was intubated in the ED. Pharmacy was consulted to start IV Vancomycin and Zosyn for empiric coverage of aspiration pneumonia.   WBC remains elevated at 14.7, CrCl down to 33 mL/min   8/27 BCx2>> NGTD 8/27 Trach asp>> NGTD   Goal of Therapy:  Vancomycin trough level 15-20 mcg/ml  Plan:  -Continue Zosyn 3.375 gm IV Q 8  hours -Change Vancomycin to 1 gm IV Q 24 hours  -Monitor CBC, renal fx, cultures and clinical progress -[facility] at [place], PharmD., BCPS Clinical Pharmacist Pager [phone]

## 2014-12-14 NOTE — Procedures (Signed)
Extubation Procedure Note  Patient Details:   Name: Jay Lester DOB: 01-Mar-1924 MRN: [id number]   Airway Documentation:     Evaluation  O2 sats: stable throughout Complications: No apparent complications Patient did tolerate procedure well. Bilateral Breath Sounds: Clear Suctioning: Airway Yes  Starlin Steib V 12/14/2014, 1:17 PM

## 2014-12-14 NOTE — Progress Notes (Signed)
Patient ID: Jay Lester, male   DOB: [birth year], 81 y.o.   MRN: [id number] Subjective:  The patient is alert and pleasant. He follows commands and nods appropriately.  Objective: Vital signs in last 24 hours: Temp:  [98.5 F (36.9 C)-99.6 F (37.6 C)] 99 F (37.2 C) (08/28 0400) Pulse Rate:  [79-106] 91 (08/28 0748) Resp:  [15-30] 18 (08/28 0748) BP: (80-171)/(44-90) 110/60 mmHg (08/28 0748) SpO2:  [92 %-100 %] 100 % (08/28 0748) FiO2 (%):  [40 %-100 %] 40 % (08/28 0748)  Intake/Output from previous day: 08/27 0701 - 08/28 0700 In: 3921.9 [I.V.:1434.4; IV Piggyback:2487.5] Out: 7616 [Urine:720; Emesis/NG output:750] Intake/Output this shift:    Physical exam the patient is Glasgow Coma Scale 11 intubated. He is moving all 4 extremities. He is mildly right hemiparetic. His craniotomy incision is healing well.  Lab Results:  Recent Labs  12/13/14 1405 12/14/14 0233  WBC 11.2* 14.7*  HGB 11.0* 9.6*  HCT 32.2* 28.9*  PLT 142* 129*   BMET  Recent Labs  12/13/14 1405 12/14/14 0233  NA 137 137  K 3.9 4.3  CL 100* 105  CO2 30 25  GLUCOSE 144* 131*  BUN 14 16  CREATININE 1.35* 1.39*  CALCIUM 8.4* 7.9*    Studies/Results: Dg Chest Port 1 View  12/13/2014   CLINICAL DATA:  Endotracheal tube and central line placement.  EXAM: PORTABLE CHEST - 1 VIEW  COMPARISON:  12/13/2014; 12/10/2014  FINDINGS: Grossly unchanged borderline enlarged cardiac silhouette and mediastinal contours with atherosclerotic plaque within the thoracic aorta. Post median sternotomy CABG. Several fractured sternotomy wires are again incidentally noted.  Interval intubation with endotracheal tube overlying the tracheal air column with tip approximately 4.5 cm above the carina. Left jugular approach central venous catheter with tip projected over the superior cavoatrial junction. Enteric tube tip and side port project below the left hemidiaphragm. No pneumothorax.  Worsening bibasilar heterogeneous  opacities, right greater than left. No definite pleural effusion or evidence of edema. Unchanged bones.  IMPRESSION: 1. Support apparatus as above.  No pneumothorax. 2. Worsening bibasilar heterogeneous opacities potentially atelectasis though worrisome for aspiration. Continued attention on follow-up is recommended.   Electronically Signed   By: Sandi Mariscal M.D.   On: 12/13/2014 11:22   Dg Chest Port 1 View  12/13/2014   CLINICAL DATA:  Cough.  EXAM: PORTABLE CHEST - 1 VIEW  COMPARISON:  12/10/2014  FINDINGS: There is patchy opacity at the right lung base. Bronchopneumonia is suspected. Milder similar opacity is noted to medial lung base which may reflect additional pneumonia or be due to atelectasis.  No convincing pulmonary edema.  No pleural effusion or pneumothorax.  Stable changes from previous CABG surgery are noted. No mediastinal or hilar masses or convincing adenopathy.  IMPRESSION: 1. Since the prior exam, opacity in the lung bases, right greater the left, has increased. Right base opacity is consistent with bronchopneumonia. Left base opacity may reflect atelectasis or additional pneumonia. No pulmonary edema.   Electronically Signed   By: Lajean Manes M.D.   On: 12/13/2014 09:44   Dg Abd Portable 1v  12/13/2014   CLINICAL DATA:  Orogastric tube placement.  EXAM: PORTABLE ABDOMEN - 1 VIEW  COMPARISON:  12/13/2014  FINDINGS: Enteric tube tip is located in the left upper quadrant consistent with location in the upper stomach. Persistent gas-filled distended small bowel loops consistent with small bowel obstruction. No radiopaque stones. Degenerative changes in the spine.  IMPRESSION: Enteric tube tip is in the left  upper quadrant consistent with location in the upper stomach. Persistent gas-filled dilated small bowel consistent with obstruction.   Electronically Signed   By: Lucienne Capers M.D.   On: 12/13/2014 21:39   Dg Abd Portable 1v  12/13/2014   CLINICAL DATA:  Acute distention of the  abdomen.  EXAM: PORTABLE ABDOMEN - 1 VIEW  COMPARISON:  None.  FINDINGS: NG tube is in place with the tip in the peripyloric region. There is gaseous distention of small bowel loops concerning for small bowel obstruction. No visible free air on this supine image. No organomegaly or suspicious calcification.  IMPRESSION: Dilated small bowel loops diffusely concerning for small bowel obstruction.   Electronically Signed   By: Rolm Baptise M.D.   On: 12/13/2014 13:57    Assessment/Plan: Status post craniotomy for subdural hematoma: The patient is stable neurologically.  Respiratory failure: The patient has been intubated. Critical care is managing the vent and his medical problems.  LOS: 8 days     Azariah Latendresse D 12/14/2014, 7:59 AM

## 2014-12-14 NOTE — Progress Notes (Signed)
Subjective: Respiratory failure overnight  Exam: Filed Vitals:   12/14/14 1000  BP: 124/61  Pulse: 98  Temp:   Resp: 21   Gen: In bed, intubated Resp: ventilated.  Abd: soft, nt  Neuro: Awake, follows commands and mouths words.    Impression: 81 year old male admitted with subdural hematoma with subsequent worsening aphasia and dysarthria due to frequent seizures. He continued to have seizures despite keppra and dilantin and therefore vimpat was added. He has had continued improvement since the addition of phenytoin an no further seizures following the addition of vimpat.   He has now been multiple days seizure free.    Recommendations: 1) continue vimpat, keppra, dilantin at current doses(can change dilantin to 300mg  ER whs once taking PO).  2) would continue AEDs at current doses for now, may consider weaning in the future. I have requested follow up as an outpatient  3) Please call with any further concerns or if seizures recurr.   Roland Rack, MD [facility] [phone]  If 7pm- 7am, please page neurology on call as listed in [place].

## 2014-12-15 ENCOUNTER — Inpatient Hospital Stay (HOSPITAL_COMMUNITY): Payer: Medicare Other

## 2014-12-15 DIAGNOSIS — I6200 Nontraumatic subdural hemorrhage, unspecified: Secondary | ICD-10-CM

## 2014-12-15 LAB — CBC
HCT: 28.5 % — ABNORMAL LOW (ref 39.0–52.0)
Hemoglobin: 9.4 g/dL — ABNORMAL LOW (ref 13.0–17.0)
MCH: 30.3 pg (ref 26.0–34.0)
MCHC: 33 g/dL (ref 30.0–36.0)
MCV: 91.9 fL (ref 78.0–100.0)
PLATELETS: 160 10*3/uL (ref 150–400)
RBC: 3.1 MIL/uL — AB (ref 4.22–5.81)
RDW: 15.3 % (ref 11.5–15.5)
WBC: 15.7 10*3/uL — ABNORMAL HIGH (ref 4.0–10.5)

## 2014-12-15 LAB — PHOSPHORUS: Phosphorus: 3.2 mg/dL (ref 2.5–4.6)

## 2014-12-15 LAB — BASIC METABOLIC PANEL
Anion gap: 6 (ref 5–15)
BUN: 22 mg/dL — AB (ref 6–20)
CHLORIDE: 105 mmol/L (ref 101–111)
CO2: 27 mmol/L (ref 22–32)
CREATININE: 1.19 mg/dL (ref 0.61–1.24)
Calcium: 8.3 mg/dL — ABNORMAL LOW (ref 8.9–10.3)
GFR calc Af Amer: >60 mL/min (ref >60)
GFR, EST NON AFRICAN AMERICAN: 52 mL/min — AB (ref >60)
GLUCOSE: 115 mg/dL — AB (ref 65–99)
POTASSIUM: 4.4 mmol/L (ref 3.5–5.1)
SODIUM: 138 mmol/L (ref 135–145)

## 2014-12-15 LAB — LEGIONELLA ANTIGEN, URINE

## 2014-12-15 LAB — MAGNESIUM: Magnesium: 2 mg/dL (ref 1.7–2.4)

## 2014-12-15 NOTE — Progress Notes (Signed)
Extubated yesterday morning. No acute overnight events Awake and alert, oriented x1 Moves all extremities briskly, follows commands Incision c/d/i No acute neurosurgical issues Continue medical management per CCM and surgery

## 2014-12-15 NOTE — Progress Notes (Signed)
7 Days Post-Op  Subjective: Awake, confused Still with a lot of NG output  Objective: Vital signs in last 24 hours: Temp:  [97 F (36.1 C)-98.4 F (36.9 C)] 97 F (36.1 C) (08/29 0727) Pulse Rate:  [29-106] 88 (08/29 0600) Resp:  [16-30] 24 (08/29 0600) BP: (106-150)/(51-79) 146/66 mmHg (08/29 0600) SpO2:  [96 %-100 %] 100 % (08/29 0600) FiO2 (%):  [40 %] 40 % (08/28 1211) Last BM Date: 12/07/14  Intake/Output from previous day: 08/28 0701 - 08/29 0700 In: 3060 [I.V.:2300; NG/GT:120; IV Piggyback:640] Out: 1930 [Urine:650; Emesis/NG output:1280] Intake/Output this shift:    Abdomen soft, non distended, non tender  Lab Results:   Recent Labs  12/14/14 0233 12/15/14 0250  WBC 14.7* 15.7*  HGB 9.6* 9.4*  HCT 28.9* 28.5*  PLT 129* 160   BMET  Recent Labs  12/14/14 0233 12/15/14 0250  NA 137 138  K 4.3 4.4  CL 105 105  CO2 25 27  GLUCOSE 131* 115*  BUN 16 22*  CREATININE 1.39* 1.19  CALCIUM 7.9* 8.3*   PT/INR No results for input(s): LABPROT, INR in the last 72 hours. ABG  Recent Labs  12/13/14 0805 12/13/14 1132  PHART 7.277* 7.346*  HCO3 28.9* 29.8*    Studies/Results: Dg Chest Port 1 View  12/15/2014   CLINICAL DATA:  Respiratory failure  EXAM: PORTABLE CHEST - 1 VIEW  COMPARISON:  Portable chest x-ray of December 14, 2014  FINDINGS: The lungs are adequately inflated. Confluent alveolar opacities in the lower lobes bilaterally are slightly less conspicuous today. There is no significant pleural effusion. The heart is top-normal in size. The pulmonary vascularity is normal. The patient has undergone previous CABG. The sternal wires are all broken. This is not new.  The left internal jugular venous catheter tip projects over the proximal third of the SVC. There has been interval placement of an esophagogastric tube. The density of the stripe on the esophagogastric tube proximally suggests that there is overlap with an endotracheal tube but the tip of an  endotracheal tube is not clearly evident.  IMPRESSION: 1. Slight interval improvement in the bilateral lower lobe infiltrates. There is no significant pleural effusion. 2. The support tubes are in reasonable position.   Electronically Signed   By: David  Martinique M.D.   On: 12/15/2014 07:41   Dg Chest Port 1 View  12/14/2014   CLINICAL DATA:  Abnormal respiration  EXAM: PORTABLE CHEST - 1 VIEW  COMPARISON:  12/14/2014  FINDINGS: Interval extubation.  Left central line remains in stable position.  Consolidation noted in both lower lobes, right greater than left, similar to prior study. Suspect small right pleural effusion. No definite visible left effusion. No acute bony abnormality. Heart is borderline in size. Prior CABG.  IMPRESSION: Continued bilateral lower lobe airspace opacities, right greater than left. Suspect small right effusion.   Electronically Signed   By: Rolm Baptise M.D.   On: 12/14/2014 15:18   Dg Chest Port 1 View  12/14/2014   CLINICAL DATA:  Respiratory failure.  EXAM: PORTABLE CHEST - 1 VIEW  COMPARISON:  1 day prior  FINDINGS: Endotracheal tube terminates 3.8 cm above carina. Left internal jugular line tip at mid SVC. Nasogastric tube extends beyond the inferior aspect of the film. Normal heart size. Probable small left pleural effusion. No pneumothorax. Mild pulmonary venous congestion. Infrahilar and basilar airspace disease bilaterally. Similar, given differences in technique.  IMPRESSION: No change in lower lobe predominant airspace disease, suspicious for infection  or aspiration.  Developing pulmonary venous congestion with a probable small left pleural effusion.   Electronically Signed   By: Abigail Miyamoto M.D.   On: 12/14/2014 09:29   Dg Chest Port 1 View  12/13/2014   CLINICAL DATA:  Endotracheal tube and central line placement.  EXAM: PORTABLE CHEST - 1 VIEW  COMPARISON:  12/13/2014; 12/10/2014  FINDINGS: Grossly unchanged borderline enlarged cardiac silhouette and mediastinal  contours with atherosclerotic plaque within the thoracic aorta. Post median sternotomy CABG. Several fractured sternotomy wires are again incidentally noted.  Interval intubation with endotracheal tube overlying the tracheal air column with tip approximately 4.5 cm above the carina. Left jugular approach central venous catheter with tip projected over the superior cavoatrial junction. Enteric tube tip and side port project below the left hemidiaphragm. No pneumothorax.  Worsening bibasilar heterogeneous opacities, right greater than left. No definite pleural effusion or evidence of edema. Unchanged bones.  IMPRESSION: 1. Support apparatus as above.  No pneumothorax. 2. Worsening bibasilar heterogeneous opacities potentially atelectasis though worrisome for aspiration. Continued attention on follow-up is recommended.   Electronically Signed   By: Sandi Mariscal M.D.   On: 12/13/2014 11:22   Dg Abd Portable 1v  12/15/2014   CLINICAL DATA:  Nasogastric tube placement.  Initial encounter.  EXAM: PORTABLE ABDOMEN - 1 VIEW  COMPARISON:  Abdominal radiograph performed earlier today at 7:34 p.m.  FINDINGS: The patient's enteric tube is seen ending overlying the body of the stomach. Contrast is seen within the stomach and proximal small bowel; it has advanced somewhat since the prior study, though findings remain compatible with relatively high-grade small bowel obstruction. There is diffuse dilatation of small-bowel loops, and residual stool is seen within the colon. No free intra-abdominal air is seen, though evaluation for free air is limited on a single supine view.  Mild degenerative change is noted along the lower lumbar spine. The patient is status post median sternotomy, with a chronically fractured inferior-most sternal wire.  IMPRESSION: 1. Enteric tube seen ending overlying the body of the stomach. 2. Contrast fills the stomach and proximal small bowel. It has advanced somewhat since the prior study, though  diffuse dilatation of small bowel loops remains compatible with relatively high-grade small bowel obstruction.   Electronically Signed   By: Garald Balding M.D.   On: 12/15/2014 02:38   Dg Abd Portable 1v-small Bowel Obstruction Protocol-initial, 8 Hr Delay  12/14/2014   CLINICAL DATA:  Small bowel obstruction.  EXAM: PORTABLE ABDOMEN - 1 VIEW  COMPARISON:  12/13/2014  FINDINGS: There is continued evidence of multiple air-filled dilated small bowel loops over the central abdomen measuring up to 4.7 cm in diameter. Contrast is present over the region of the stomach and proximal small bowel. Minimal air within the colon. Remainder of the exam is unchanged.  IMPRESSION: Stable findings suggesting small bowel obstruction. Contrast present over the region of the stomach and proximal small bowel.   Electronically Signed   By: Marin Olp M.D.   On: 12/14/2014 19:49   Dg Abd Portable 1v  12/13/2014   CLINICAL DATA:  Orogastric tube placement.  EXAM: PORTABLE ABDOMEN - 1 VIEW  COMPARISON:  12/13/2014  FINDINGS: Enteric tube tip is located in the left upper quadrant consistent with location in the upper stomach. Persistent gas-filled distended small bowel loops consistent with small bowel obstruction. No radiopaque stones. Degenerative changes in the spine.  IMPRESSION: Enteric tube tip is in the left upper quadrant consistent with location in the upper  stomach. Persistent gas-filled dilated small bowel consistent with obstruction.   Electronically Signed   By: Lucienne Capers M.D.   On: 12/13/2014 21:39   Dg Abd Portable 1v  12/13/2014   CLINICAL DATA:  Acute distention of the abdomen.  EXAM: PORTABLE ABDOMEN - 1 VIEW  COMPARISON:  None.  FINDINGS: NG tube is in place with the tip in the peripyloric region. There is gaseous distention of small bowel loops concerning for small bowel obstruction. No visible free air on this supine image. No organomegaly or suspicious calcification.  IMPRESSION: Dilated small bowel  loops diffusely concerning for small bowel obstruction.   Electronically Signed   By: Rolm Baptise M.D.   On: 12/13/2014 13:57    Anti-infectives: Anti-infectives    Start     Dose/Rate Route Frequency Ordered Stop   12/14/14 1200  vancomycin (VANCOCIN) IVPB 1000 mg/200 mL premix     1,000 mg 200 mL/hr over 60 Minutes Intravenous Every 24 hours 12/14/14 1058     12/13/14 1200  piperacillin-tazobactam (ZOSYN) IVPB 3.375 g     3.375 g 12.5 mL/hr over 240 Minutes Intravenous Every 8 hours 12/13/14 1119     12/13/14 1130  vancomycin (VANCOCIN) 1,250 mg in sodium chloride 0.9 % 250 mL IVPB     1,250 mg 166.7 mL/hr over 90 Minutes Intravenous  Once 12/13/14 1120 12/13/14 1451   12/03/2014 2300  ceFAZolin (ANCEF) IVPB 2 g/50 mL premix     2 g 100 mL/hr over 30 Minutes Intravenous 3 times per day 11/19/2014 1823 12/09/14 0534   12/09/2014 1830  ceFAZolin (ANCEF) NICU IV syringe 100 mg/mL  Status:  Discontinued     2,000 mg 240 mL/hr over 5 Minutes Intravenous Every 8 hours 11/27/2014 1816 12/02/2014 1823   11/30/2014 1530  bacitracin 50,000 Units in sodium chloride irrigation 0.9 % 500 mL irrigation  Status:  Discontinued       As needed 12/10/2014 1530 11/28/2014 1644   11/17/2014 1500  cefTRIAXone (ROCEPHIN) 1 g in dextrose 5 % 50 mL IVPB     1 g 100 mL/hr over 30 Minutes Intravenous To Neuro OR-Station #32 12/14/2014 1458 12/07/2014 1507   12/05/2014 2300  [MAR Hold]  ceFAZolin (ANCEF) IVPB 2 g/50 mL premix     (MAR Hold since 12/11/2014 1316)  Comments:  Send with patient to or   2 g 100 mL/hr over 30 Minutes Intravenous  Once 12/10/2014 2214 12/05/2014 1451      Assessment/Plan: s/p Procedure(s) with comments: CRANIOTOMY HEMATOMA EVACUATION SUBDURAL (Left) - left  Suspected post op ileus  Check abd xray Continue NPO and NG  LOS: 9 days    Valjean Ruppel A 12/15/2014

## 2014-12-15 NOTE — Progress Notes (Signed)
Inpatient Rehabilitation   Pt. was intubated on 12/13/14 due to worsening dysarthria, frequent seizures, suspected aspirations and poor airway protection.  Pt. extubated 12/14/14.  Currently has mitts on both hands, attempting to speak to me but not understandable due to aphasia.  Has ileus with NG tube in place.  I will continue to follow for possible appropriateness for IP Rehab.    [facility] Admissions Coordinator Cell [phone] Office [phone]

## 2014-12-15 NOTE — Progress Notes (Signed)
PULMONARY / CRITICAL CARE MEDICINE   Name: Jay Lester MRN: [id number] DOB: 05/06/1923    ADMISSION DATE:  11/30/2014 CONSULTATION DATE:  8/27  REFERRING MD :  Neuro  CHIEF COMPLAINT:  Resp distress  INITIAL PRESENTATION: SDH  STUDIES:    SIGNIFICANT EVENTS: 8/27 intubated   HISTORY OF PRESENT ILLNESS:   81 yo WM with evacuation of left frontal SDH 8/22 and had done well post op until 8/27 when he developed worsening dysarthria, frequent seizures, suspected aspirations and poor airway protection. PCCM was consulted and urgent intubation intubation  Performed. PCCM will assist  In his care  SUBJECTIVE:  Awake, interacts and coughs on command  Trying to speak but difficult to understand  VITAL SIGNS: Temp:  [97 F (36.1 C)-98.4 F (36.9 C)] 97 F (36.1 C) (08/29 0727) Pulse Rate:  [29-106] 89 (08/29 0800) Resp:  [16-30] 21 (08/29 0800) BP: (106-150)/(51-79) 149/67 mmHg (08/29 0800) SpO2:  [96 %-100 %] 100 % (08/29 0800) FiO2 (%):  [40 %] 40 % (08/28 1211) HEMODYNAMICS:   VENTILATOR SETTINGS: Vent Mode:  [-] PSV;CPAP FiO2 (%):  [40 %] 40 % PEEP:  [5 cmH20] 5 cmH20 Pressure Support:  [5 cmH20] 5 cmH20 INTAKE / OUTPUT:  Intake/Output Summary (Last 24 hours) at 12/15/14 4259 Last data filed at 12/15/14 0800  Gross per 24 hour  Intake   3060 ml  Output   1885 ml  Net   1175 ml    PHYSICAL EXAMINATION: General: ill appearing man, NAD Neuro:  Awake, interacting somewhat weak cough, able to speak and follow commands.  HEENT:  NGT in place, fails to fully clear secretions  Cardiovascular:  HSR RRR Lungs:  B insp crackles  Abdomen:  Soft +bs but faint Musculoskeletal:  intact Skin: L crani incision noted   LABS:  CBC  Recent Labs Lab 12/13/14 1405 12/14/14 0233 12/15/14 0250  WBC 11.2* 14.7* 15.7*  HGB 11.0* 9.6* 9.4*  HCT 32.2* 28.9* 28.5*  PLT 142* 129* 160   Coag's No results for input(s): APTT, INR in the last 168 hours. BMET  Recent  Labs Lab 12/13/14 1405 12/14/14 0233 12/15/14 0250  NA 137 137 138  K 3.9 4.3 4.4  CL 100* 105 105  CO2 30 25 27   BUN 14 16 22*  CREATININE 1.35* 1.39* 1.19  GLUCOSE 144* 131* 115*   Electrolytes  Recent Labs Lab 12/13/14 1405 12/14/14 0233 12/15/14 0250  CALCIUM 8.4* 7.9* 8.3*  MG 1.4* 1.9 2.0  PHOS 3.3 3.9 3.2   Sepsis Markers  Recent Labs Lab 12/13/14 2305  LATICACIDVEN 1.6   ABG  Recent Labs Lab 12/13/14 0805 12/13/14 1132  PHART 7.277* 7.346*  PCO2ART 64.1* 54.4*  PO2ART 67.7* 67.0*   Liver Enzymes No results for input(s): AST, ALT, ALKPHOS, BILITOT, ALBUMIN in the last 168 hours. Cardiac Enzymes  Recent Labs Lab 12/14/14 0233  TROPONINI <0.03   Glucose No results for input(s): GLUCAP in the last 168 hours.  Imaging Dg Chest Port 1 View  12/15/2014   CLINICAL DATA:  Respiratory failure  EXAM: PORTABLE CHEST - 1 VIEW  COMPARISON:  Portable chest x-ray of December 14, 2014  FINDINGS: The lungs are adequately inflated. Confluent alveolar opacities in the lower lobes bilaterally are slightly less conspicuous today. There is no significant pleural effusion. The heart is top-normal in size. The pulmonary vascularity is normal. The patient has undergone previous CABG. The sternal wires are all broken. This is not new.  The left internal jugular  venous catheter tip projects over the proximal third of the SVC. There has been interval placement of an esophagogastric tube. The density of the stripe on the esophagogastric tube proximally suggests that there is overlap with an endotracheal tube but the tip of an endotracheal tube is not clearly evident.  IMPRESSION: 1. Slight interval improvement in the bilateral lower lobe infiltrates. There is no significant pleural effusion. 2. The support tubes are in reasonable position.   Electronically Signed   By: David  Martinique M.D.   On: 12/15/2014 07:41   Dg Abd Portable 1v  12/15/2014   CLINICAL DATA:  Small bowel  obstruction. Evaluate NG tube placement.  EXAM: PORTABLE ABDOMEN - 1 VIEW  COMPARISON:  12/14/2014.  FINDINGS: There is an NG tube with tip in the stomach. The side port for the NG tube is below the GE junction. Dilated loops of small bowel are again noted and appear unchanged from previous exam. Enteric contrast material is identified within small bowel loops. No contrast identified within the colon.  IMPRESSION: 1. NG tube in satisfactory position within the stomach. 2. Persistent small bowel dilatation compatible with small bowel obstruction. Enteric contrast material remains within the small bowel.   Electronically Signed   By: Kerby Moors M.D.   On: 12/15/2014 09:19   Dg Abd Portable 1v  12/15/2014   CLINICAL DATA:  Nasogastric tube placement.  Initial encounter.  EXAM: PORTABLE ABDOMEN - 1 VIEW  COMPARISON:  Abdominal radiograph performed earlier today at 7:34 p.m.  FINDINGS: The patient's enteric tube is seen ending overlying the body of the stomach. Contrast is seen within the stomach and proximal small bowel; it has advanced somewhat since the prior study, though findings remain compatible with relatively high-grade small bowel obstruction. There is diffuse dilatation of small-bowel loops, and residual stool is seen within the colon. No free intra-abdominal air is seen, though evaluation for free air is limited on a single supine view.  Mild degenerative change is noted along the lower lumbar spine. The patient is status post median sternotomy, with a chronically fractured inferior-most sternal wire.  IMPRESSION: 1. Enteric tube seen ending overlying the body of the stomach. 2. Contrast fills the stomach and proximal small bowel. It has advanced somewhat since the prior study, though diffuse dilatation of small bowel loops remains compatible with relatively high-grade small bowel obstruction.   Electronically Signed   By: Garald Balding M.D.   On: 12/15/2014 02:38   Dg Abd Portable 1v-small Bowel  Obstruction Protocol-initial, 8 Hr Delay  12/14/2014   CLINICAL DATA:  Small bowel obstruction.  EXAM: PORTABLE ABDOMEN - 1 VIEW  COMPARISON:  12/13/2014  FINDINGS: There is continued evidence of multiple air-filled dilated small bowel loops over the central abdomen measuring up to 4.7 cm in diameter. Contrast is present over the region of the stomach and proximal small bowel. Minimal air within the colon. Remainder of the exam is unchanged.  IMPRESSION: Stable findings suggesting small bowel obstruction. Contrast present over the region of the stomach and proximal small bowel.   Electronically Signed   By: Marin Olp M.D.   On: 12/14/2014 19:49     ASSESSMENT / PLAN:  PULMONARY OETT 8/27>> 8/28 A: VDRF secondary to aspiration  and PNA P:   Continue abx Consider diuresis in future if he can otherwise tolerate Push pulm hygiene - may need NTS or deep suctioning periodically  CARDIOVASCULAR CVL left I J cvl 8/27>> A: No acute issue P:   RENAL  A:   Acute renal failure, resolved P:    GASTROINTESTINAL A:   Malnourished Ileus P:   NGT in place CCS assistance appreciated  HEMATOLOGIC A:  No acute issue P:   INFECTIOUS A:   Suspected aspiration P:   BCx2  8/27>> UC8/27>> Sputum 8/27>> Abx: 8/27 vanc>> 8/27 pip-tazo>>  Will plan to narrow abx cx's dictate, continue same for now  ENDOCRINE A:   No acute issue  P:    NEUROLOGIC  A:   L sdh post evac 8/22 with worsening mental status 8/27, now improved P:   RASS goal: 0-1 NS/Neu following   FAMILY  - Updates:   - Inter-disciplinary family meet or Palliative Care meeting due by:  day 7   TODAY'S SUMMARY:  81 yo WM with evacuation of left frontal SDH 8/22 and had done well post op until 8/27 when he developed worsening dysarthria, frequent seizures, suspected aspirations and poor airway protection. PCCM was consulted and urgent intubation performed. Now extubated, pushing pulm hygiene and covering for  suspected PNA.    Baltazar Apo, MD, PhD 12/15/2014, 9:29 AM [facility] [phone] or if no answer [phone]

## 2014-12-16 ENCOUNTER — Inpatient Hospital Stay (HOSPITAL_COMMUNITY): Payer: Medicare Other

## 2014-12-16 LAB — GLUCOSE, CAPILLARY
Glucose-Capillary: 123 mg/dL — ABNORMAL HIGH (ref 65–99)
Glucose-Capillary: 123 mg/dL — ABNORMAL HIGH (ref 65–99)
Glucose-Capillary: 117 mg/dL — ABNORMAL HIGH (ref 65–99)
Glucose-Capillary: 103 mg/dL — ABNORMAL HIGH (ref 65–99)

## 2014-12-16 LAB — SODIUM: Sodium: 141 mmol/L (ref 135–145)

## 2014-12-16 LAB — CULTURE, RESPIRATORY W GRAM STAIN: Special Requests: NORMAL

## 2014-12-16 LAB — CULTURE, RESPIRATORY

## 2014-12-16 MED ORDER — BISACODYL 10 MG RE SUPP
10.0000 mg | Freq: Once | RECTAL | Status: AC
  Administered 2014-12-16: 10 mg via RECTAL
  Filled 2014-12-16: qty 1

## 2014-12-16 MED ORDER — METOCLOPRAMIDE HCL 5 MG/ML IJ SOLN
5.0000 mg | Freq: Three times a day (TID) | INTRAMUSCULAR | Status: AC
  Administered 2014-12-16 – 2014-12-17 (×3): 5 mg via INTRAVENOUS
  Filled 2014-12-16 (×3): qty 2

## 2014-12-16 NOTE — Progress Notes (Signed)
Patient ID: Jay Lester, male   DOB: 05/15/1923, 81 y.o.   MRN: [id number] 8 Days Post-Op  Subjective: Pt with no complaints.  No flatus or BM that is aware of.  No abdominal pain  Objective: Vital signs in last 24 hours: Temp:  [97.8 F (36.6 C)-99.1 F (37.3 C)] 99.1 F (37.3 C) (08/30 0800) Pulse Rate:  [75-96] 81 (08/30 0800) Resp:  [16-27] 16 (08/30 0800) BP: (129-181)/(54-122) 153/77 mmHg (08/30 0800) SpO2:  [90 %-98 %] 95 % (08/30 0800) Last BM Date: 12/07/14  Intake/Output from previous day: 08/29 0701 - 08/30 0700 In: 4420 [I.V.:2400; IV Piggyback:395] Out: 600 [Emesis/NG output:600] Intake/Output this shift: Total I/O In: 200 [I.V.:100; IV Piggyback:100] Out: -   PE: Abd: soft, NT, ND, hypoactive BS, NGT with bilious output, only 600cc yesterday Heart: slightly tachy Lungs: Diffuse rhonchi noted  Lab Results:   Recent Labs  12/14/14 0233 12/15/14 0250  WBC 14.7* 15.7*  HGB 9.6* 9.4*  HCT 28.9* 28.5*  PLT 129* 160   BMET  Recent Labs  12/14/14 0233 12/15/14 0250 12/16/14 0458  NA 137 138 141  K 4.3 4.4  --   CL 105 105  --   CO2 25 27  --   GLUCOSE 131* 115*  --   BUN 16 22*  --   CREATININE 1.39* 1.19  --   CALCIUM 7.9* 8.3*  --    PT/INR No results for input(s): LABPROT, INR in the last 72 hours. CMP     Component Value Date/Time   NA 141 12/16/2014 0458   K 4.4 12/15/2014 0250   CL 105 12/15/2014 0250   CO2 27 12/15/2014 0250   GLUCOSE 115* 12/15/2014 0250   BUN 22* 12/15/2014 0250   CREATININE 1.19 12/15/2014 0250   CREATININE 1.19 10/30/2012 0725   CALCIUM 8.3* 12/15/2014 0250   PROT 6.5 12/01/2014 1612   ALBUMIN 4.1 12/14/2014 1612   AST 21 12/04/2014 1612   ALT 14* 12/11/2014 1612   ALKPHOS 45 11/22/2014 1612   BILITOT 0.9 11/17/2014 1612   GFRNONAA 52* 12/15/2014 0250   GFRAA >60 12/15/2014 0250   Lipase  No results found for: LIPASE     Studies/Results: Dg Chest Port 1 View  12/15/2014   CLINICAL DATA:   Respiratory failure  EXAM: PORTABLE CHEST - 1 VIEW  COMPARISON:  Portable chest x-ray of December 14, 2014  FINDINGS: The lungs are adequately inflated. Confluent alveolar opacities in the lower lobes bilaterally are slightly less conspicuous today. There is no significant pleural effusion. The heart is top-normal in size. The pulmonary vascularity is normal. The patient has undergone previous CABG. The sternal wires are all broken. This is not new.  The left internal jugular venous catheter tip projects over the proximal third of the SVC. There has been interval placement of an esophagogastric tube. The density of the stripe on the esophagogastric tube proximally suggests that there is overlap with an endotracheal tube but the tip of an endotracheal tube is not clearly evident.  IMPRESSION: 1. Slight interval improvement in the bilateral lower lobe infiltrates. There is no significant pleural effusion. 2. The support tubes are in reasonable position.   Electronically Signed   By: David  Martinique M.D.   On: 12/15/2014 07:41   Dg Abd Portable 1v  12/15/2014   CLINICAL DATA:  Small bowel obstruction. Evaluate NG tube placement.  EXAM: PORTABLE ABDOMEN - 1 VIEW  COMPARISON:  12/14/2014.  FINDINGS: There is an NG tube with  tip in the stomach. The side port for the NG tube is below the GE junction. Dilated loops of small bowel are again noted and appear unchanged from previous exam. Enteric contrast material is identified within small bowel loops. No contrast identified within the colon.  IMPRESSION: 1. NG tube in satisfactory position within the stomach. 2. Persistent small bowel dilatation compatible with small bowel obstruction. Enteric contrast material remains within the small bowel.   Electronically Signed   By: Kerby Moors M.D.   On: 12/15/2014 09:19   Dg Abd Portable 1v  12/15/2014   CLINICAL DATA:  Nasogastric tube placement.  Initial encounter.  EXAM: PORTABLE ABDOMEN - 1 VIEW  COMPARISON:  Abdominal  radiograph performed earlier today at 7:34 p.m.  FINDINGS: The patient's enteric tube is seen ending overlying the body of the stomach. Contrast is seen within the stomach and proximal small bowel; it has advanced somewhat since the prior study, though findings remain compatible with relatively high-grade small bowel obstruction. There is diffuse dilatation of small-bowel loops, and residual stool is seen within the colon. No free intra-abdominal air is seen, though evaluation for free air is limited on a single supine view.  Mild degenerative change is noted along the lower lumbar spine. The patient is status post median sternotomy, with a chronically fractured inferior-most sternal wire.  IMPRESSION: 1. Enteric tube seen ending overlying the body of the stomach. 2. Contrast fills the stomach and proximal small bowel. It has advanced somewhat since the prior study, though diffuse dilatation of small bowel loops remains compatible with relatively high-grade small bowel obstruction.   Electronically Signed   By: Garald Balding M.D.   On: 12/15/2014 02:38   Dg Abd Portable 1v-small Bowel Obstruction Protocol-initial, 8 Hr Delay  12/14/2014   CLINICAL DATA:  Small bowel obstruction.  EXAM: PORTABLE ABDOMEN - 1 VIEW  COMPARISON:  12/13/2014  FINDINGS: There is continued evidence of multiple air-filled dilated small bowel loops over the central abdomen measuring up to 4.7 cm in diameter. Contrast is present over the region of the stomach and proximal small bowel. Minimal air within the colon. Remainder of the exam is unchanged.  IMPRESSION: Stable findings suggesting small bowel obstruction. Contrast present over the region of the stomach and proximal small bowel.   Electronically Signed   By: Marin Olp M.D.   On: 12/14/2014 19:49    Anti-infectives: Anti-infectives    Start     Dose/Rate Route Frequency Ordered Stop   12/14/14 1200  vancomycin (VANCOCIN) IVPB 1000 mg/200 mL premix     1,000 mg 200 mL/hr  over 60 Minutes Intravenous Every 24 hours 12/14/14 1058     12/13/14 1200  piperacillin-tazobactam (ZOSYN) IVPB 3.375 g     3.375 g 12.5 mL/hr over 240 Minutes Intravenous Every 8 hours 12/13/14 1119     12/13/14 1130  vancomycin (VANCOCIN) 1,250 mg in sodium chloride 0.9 % 250 mL IVPB     1,250 mg 166.7 mL/hr over 90 Minutes Intravenous  Once 12/13/14 1120 12/13/14 1451   11/18/2014 2300  ceFAZolin (ANCEF) IVPB 2 g/50 mL premix     2 g 100 mL/hr over 30 Minutes Intravenous 3 times per day 11/23/2014 1823 12/09/14 0534   11/22/2014 1830  ceFAZolin (ANCEF) NICU IV syringe 100 mg/mL  Status:  Discontinued     2,000 mg 240 mL/hr over 5 Minutes Intravenous Every 8 hours 11/18/2014 1816 12/04/2014 1823   12/02/2014 1530  bacitracin 50,000 Units in sodium chloride irrigation  0.9 % 500 mL irrigation  Status:  Discontinued       As needed 12/10/2014 1530 12/09/2014 1644   12/05/2014 1500  cefTRIAXone (ROCEPHIN) 1 g in dextrose 5 % 50 mL IVPB     1 g 100 mL/hr over 30 Minutes Intravenous To Neuro OR-Station #32 11/29/2014 1458 12/17/2014 1507   12/05/2014 2300  [MAR Hold]  ceFAZolin (ANCEF) IVPB 2 g/50 mL premix     (MAR Hold since 12/01/2014 1316)  Comments:  Send with patient to or   2 g 100 mL/hr over 30 Minutes Intravenous  Once 12/09/2014 2214 11/23/2014 1451       Assessment/Plan  1. Ileus vs psbo -patient acts more like an ileus than a SBO -repeat films today and see if contrast and moved on to colon.  He has some air and stool in his colon on his films from yesterday.  Will give a dulcolax suppository today. -no surgical indications currently.  Cont conservative management and follow.   LOS: 10 days    Colt Martelle E 12/16/2014, 9:04 AM Pager: [phone]

## 2014-12-16 NOTE — Progress Notes (Signed)
No acute events Awake and alert, oriented x2 Moves all extremities briskly, follows commands Incision c/d/i No active neurosurgical issues Continue management per CCM and Surgery.

## 2014-12-16 NOTE — Progress Notes (Signed)
RN staff attempted ambulation this evening, pt required max 2 assist for standing and pivoting to the recliner.  Pt became SOB/tachypneic during transfer.  Pt back to bed.  PT to see pt tomorrow.

## 2014-12-16 NOTE — Progress Notes (Signed)
Inpatient Rehabilitation  I note pt. with ileus vs. PSBO, has ng tube.   I discussed with Barbette Or, SW and Ellan Lambert, CM that pt. might need SNF as back up plan if he can't tolerate the rigors of IP Rehab when he becomes more medically stable.  Will continue to follow.  Please call if questions.  [place] Admissions Coordinator Cell [phone] Office [phone]

## 2014-12-16 NOTE — Care Management Note (Signed)
Case Management Note  Patient Details  Name: Jay Lester MRN: [id number] Date of Birth: 02/06/1924  Subjective/Objective:   Pt s/p craniotomy on 12/13/2014 for evacuation of SDH.  Pt developed ileus postoperatively.                   Action/Plan: PT/OT recommending CIR; CSW following for SNF backup, if needed. Will follow progress.    Expected Discharge Date:                  Expected Discharge Plan:  IP Rehab Facility  In-House Referral:  Clinical Social Work  Discharge planning Services  CM Consult  Post Acute Care Choice:    Choice offered to:     DME Arranged:    DME Agency:     HH Arranged:    [place] Agency:     Status of Service:  In process, will continue to follow  Medicare Important Message Given:  Yes-third notification given Date Medicare IM Given:    Medicare IM give by:    Date Additional Medicare IM Given:    Additional Medicare Important Message give by:     If discussed at [place] of Stay Meetings, dates discussed:    Additional Comments:  Reinaldo Raddle, RN, BSN  Trauma/Neuro ICU Case Manager [phone]

## 2014-12-17 LAB — SODIUM: SODIUM: 143 mmol/L (ref 135–145)

## 2014-12-17 MED ORDER — FLEET ENEMA 7-19 GM/118ML RE ENEM
1.0000 | ENEMA | Freq: Once | RECTAL | Status: AC
  Administered 2014-12-17: 1 via RECTAL
  Filled 2014-12-17: qty 1

## 2014-12-17 MED ORDER — INFLUENZA VAC SPLIT QUAD 0.5 ML IM SUSY
0.5000 mL | PREFILLED_SYRINGE | INTRAMUSCULAR | Status: AC
  Administered 2014-12-18: 0.5 mL via INTRAMUSCULAR
  Filled 2014-12-17: qty 0.5

## 2014-12-17 MED ORDER — METOCLOPRAMIDE HCL 5 MG/ML IJ SOLN
10.0000 mg | Freq: Three times a day (TID) | INTRAMUSCULAR | Status: AC
  Administered 2014-12-17 (×3): 10 mg via INTRAVENOUS
  Filled 2014-12-17 (×3): qty 2

## 2014-12-17 NOTE — Progress Notes (Signed)
Inpatient Rehabilitation  Note pt. With ongoing issues with ileus.  Will continue to monitor for appropriateness for CIR when medically ready.    [place] Admissions Coordinator Cell [phone] Office [phone]

## 2014-12-17 NOTE — Progress Notes (Signed)
No acute events Awake and alert, oriented x2, mental status improved from yesterday, no longer agitated Moves all extremities with full strength No pronator drift Incision c/d/i with staples No active neurosurgical issues Continue management per CCM and Surgery

## 2014-12-17 NOTE — Progress Notes (Signed)
Pt pulled central line, pressure dressing applied, ELink notified, 2 PIVs started.  NG tube placement verified via auscultation and visible gastric contents within the tube.  Order for PICC line placed by CCM. [place] currently on.

## 2014-12-17 NOTE — Progress Notes (Signed)
Initial Nutrition Assessment  DOCUMENTATION CODES:   Severe malnutrition in context of chronic illness  INTERVENTION:   Supplement diet as appropriate once able to advance  Recommend consider initiating parenteral nutrition support if NPO status expected for several more days as pt meets criteria for severe malnutrition.    NUTRITION DIAGNOSIS:   Malnutrition related to altered GI function as evidenced by severe depletion of body fat, severe depletion of muscle mass.   GOAL:   Patient will meet greater than or equal to 90% of their needs   MONITOR:   Diet advancement, Weight trends, I & O's  REASON FOR ASSESSMENT:   Rounds    ASSESSMENT:   Pt s/p craniotomy on 12/09/2014 for evacuation of SDH. Pt developed ileus postoperatively.  Pt discussed during ICU rounds and with RN.  Pt consumed 100% of his meals on 8/21, made NPO 8/22, on regular diet eating 50% 8/23 through 8/26. Pt has been NPO for last 5 days.   Pt with NGT for suction, hypoactive bowel sounds, Reglan IV TID. Per MD xray shows likely continued ileus. Enemas also started with only mucus as results so far.   Per family pt has been steadily losing weight and becoming more weak at home, had previous fall in May 2016 as well. Daughter has helped pt shave recently and noticed that pt had lost a lot of weight in his face.  Pt eats small bowl of raisin bran for Breakfast, they always eat [place] out for lunch, this is usually a lunch portion of fajitas or ACP which they split, and supper is only a snack, usually of crackers and peanut butter.  Nutrition-Focused physical exam completed. Findings are severe fat depletion, severe muscle depletion, and mild edema.    Diet Order:  Diet NPO time specified  Skin:  Reviewed, no issues  Last BM:  8/31 smear after enema  Height:   Ht Readings from Last 1 Encounters:  11/21/2014 5\' 7"  (1.702 m)    Weight:   Wt Readings from Last 1 Encounters:  12/05/2014 141 lb 12.1  oz (64.3 kg)    Ideal Body Weight:  67.2 kg  BMI:  Body mass index is 22.2 kg/(m^2).  Estimated Nutritional Needs:   Kcal:  1800-2000  Protein:  85-100 grams  Fluid:  > 1.8 L/day  EDUCATION NEEDS:   No education needs identified at this time  [place], [place], [place] Pager [phone] After Hours Pager

## 2014-12-17 NOTE — Progress Notes (Signed)
eLink Physician-Brief Progress Note Patient Name: Jay Lester DOB: 30-Aug-1923 MRN: [id number]   Date of Service  12/17/2014  HPI/Events of Note  Patient pulled out vascular access.  eICU Interventions  PICC     Intervention Category Major Interventions: Other:  Tera Partridge 12/17/2014, 4:01 PM

## 2014-12-17 NOTE — Progress Notes (Signed)
Physical Therapy Treatment Patient Details Name: Jay Lester MRN: [id number] DOB: 1924/03/02 Today's Date: 12/17/2014    History of Present Illness 81 year old male with a symptomatic acute on chronic left frontal subdural hematoma; s/p evacuation of hematoma.    PT Comments    Progressing steadily post respiratory problems this past weekend.  Emphasis on standing, pregait, transfers.  Follow Up Recommendations  CIR;Supervision/Assistance - 24 hour     Equipment Recommendations  Rolling walker with 5" wheels    Recommendations for Other Services       Precautions / Restrictions Precautions Precautions: Fall    Mobility  Bed Mobility Overal bed mobility: Needs Assistance Bed Mobility: Supine to Sit     Supine to sit: Mod assist;HOB elevated        Transfers Overall transfer level: Needs assistance Equipment used: 2 person hand held assist Transfers: Sit to/from Stand;Stand Pivot Transfers Sit to Stand: +2 physical assistance;Min assist Stand pivot transfers: Mod assist;+2 physical assistance       General transfer comment: cues for hand placement/technique./postural cues  Ambulation/Gait             General Gait Details: Just marched in place and other pregait activity   Stairs            Wheelchair Mobility    Modified Rankin (Stroke Patients Only) Modified Rankin (Stroke Patients Only) Pre-Morbid Rankin Score: No symptoms Modified Rankin: Moderately severe disability     Balance Overall balance assessment: Needs assistance   Sitting balance-Leahy Scale: Fair     Standing balance support: During functional activity Standing balance-Leahy Scale: Poor Standing balance comment: marched in place/w/shifting assist                    Cognition Arousal/Alertness: Awake/alert Behavior During Therapy: WFL for tasks assessed/performed Overall Cognitive Status: Impaired/Different from baseline Area of Impairment:  Awareness;Safety/judgement   Current Attention Level: Selective   Following Commands: Follows one step commands consistently   Awareness: Emergent   General Comments: Improved cognitively    Exercises      General Comments General comments (skin integrity, edema, etc.): elevated BP  179/88 (111)      Pertinent Vitals/Pain Pain Assessment: No/denies pain    Home Living Family/patient expects to be discharged to:: Inpatient rehab                    Prior Function Level of Independence: Independent      Comments: likes to mow the yard- does a garden in the spring   PT Goals (current goals can now be found in the care plan section) Acute Rehab PT Goals Patient Stated Goal: to get stronger Time For Goal Achievement: 12/23/14 Potential to Achieve Goals: Good Progress towards PT goals: Progressing toward goals    Frequency  Min 3X/week    PT Plan      Co-evaluation PT/OT/SLP Co-Evaluation/Treatment: Yes Reason for Co-Treatment: Complexity of the patient's impairments (multi-system involvement);For patient/therapist safety PT goals addressed during session: Mobility/safety with mobility OT goals addressed during session: ADL's and self-care;Strengthening/ROM     End of Session   Activity Tolerance: Patient tolerated treatment well;Patient limited by fatigue Patient left: in chair;with call bell/phone within reach     Time: 1037-1108 PT Time Calculation (min) (ACUTE ONLY): 31 min  Charges:  $Therapeutic Activity: 8-22 mins                    G Codes:  Jay Lester, Jay Lester 12/17/2014, 1:11 PM  12/17/2014  Jay Lester, [name] [phone]  (pager)

## 2014-12-17 NOTE — Progress Notes (Signed)
PCCM brief note  I discussed Patient's progress with Dr Cyndy Freeze this am. He looks ready for transfer to SDU, still dealing with ileus. I will ask [facility] to take over his care as of 9/1. Dr Cyndy Freeze will write transfer orders to SDU.   Baltazar Apo, MD, PhD 12/17/2014, 10:32 AM [place] [facility] [phone] or if no answer [phone]

## 2014-12-17 NOTE — Progress Notes (Signed)
Occupational Therapy Treatment Patient Details Name: Rangel Echeverri MRN: [id number] DOB: 05/18/1923 Today's Date: 12/17/2014    History of present illness 81 year old male with a symptomatic acute on chronic left frontal subdural hematoma; s/p evacuation of hematoma.Decline in medical status. Intubate 8/27. Extubated 8/28.    OT comments  Pt seen as cotreat with PT today. Required +2 Mod A with stand pivot transfer and min A + 2 with sit - stand. BP 179/88 sitting. 4L O2. Cognition improved today. BUE generalized weakness. Using BUE functionally. If pt continues to progress, feel he would benefit from CIR. Will continue to follow to address established goals. Pt very pleasant with excellent participation. Updated wife on progress.   Follow Up Recommendations  CIR    Equipment Recommendations  3 in 1 bedside comode    Recommendations for Other Services Rehab consult    Precautions / Restrictions Precautions Precautions: Fall Restrictions Weight Bearing Restrictions: No       Mobility Bed Mobility Overal bed mobility: Needs Assistance Bed Mobility: Supine to Sit     Supine to sit: Mod assist;HOB elevated        Transfers Overall transfer level: Needs assistance Equipment used: 2 person hand held assist Transfers: Sit to/from Stand;Stand Pivot Transfers Sit to Stand: +2 physical assistance;Min assist Stand pivot transfers: Mod assist;+2 physical assistance            Balance     Sitting balance-Leahy Scale: Fair     Standing balance support: During functional activity Standing balance-Leahy Scale: Poor                     ADL Overall ADL's : Needs assistance/impaired     Grooming: Moderate assistance   Upper Body Bathing: Moderate assistance   Lower Body Bathing: Moderate assistance;Sit to/from stand   Upper Body Dressing : Moderate assistance;Sitting   Lower Body Dressing: Moderate assistance;Sit to/from stand   Toilet Transfer: Moderate  assistance;+2 for physical assistance   Toileting- Clothing Manipulation and Hygiene: Maximal assistance;Sit to/from stand (foley)       Functional mobility during ADLs: Moderate assistance;+2 for physical assistance General ADL Comments: Pt limited by general weakness and poor endurance      Vision                 Additional Comments: No gaze preference. will further assess   Perception     Praxis      Cognition   Behavior During Therapy: [facility] for tasks assessed/performed Overall Cognitive Status: Impaired/Different from baseline Area of Impairment: Awareness;Safety/judgement   Current Attention Level: Selective    Following Commands: Follows one step commands consistently   Awareness: Emergent   General Comments: Improved cognitively    Extremity/Trunk Assessment  Upper Extremity Assessment Upper Extremity Assessment: Generalized weakness   Lower Extremity Assessment Lower Extremity Assessment: Defer to PT evaluation   Cervical / Trunk Assessment Cervical / Trunk Assessment: Kyphotic    Exercises     Shoulder Instructions       General Comments      Pertinent Vitals/ Pain       Pain Assessment: No/denies pain  Home Living Family/patient expects to be discharged to:: Inpatient rehab                                        Prior Functioning/Environment Level of Independence: Independent  Comments: likes to mow the yard- does a garden in the spring   Frequency Min 3X/week     Progress Toward Goals  OT Goals(current goals can now be found in the care plan section)     Acute Rehab OT Goals Patient Stated Goal: to get stronger OT Goal Formulation: With patient/family Time For Goal Achievement: 12/31/14 Potential to Achieve Goals: Good  Plan      Co-evaluation                 End of Session Equipment Utilized During Treatment: Gait belt;Oxygen   Activity Tolerance Patient tolerated treatment well    Patient Left in chair;with call bell/phone within reach;with chair alarm set   Nurse Communication Mobility status   Cotreat with PT - see doc flow. Nec due to complexity of care. Ot addressing strength and ADL     Time: 2072-1828 OT Time Calculation (min): 31 min  Charges: OT General Charges $OT Visit: 1 Procedure OT Treatments $Therapeutic Activity: 8-22 mins  Taygen Acklin,HILLARY 12/17/2014, 11:32 AM

## 2014-12-17 NOTE — Progress Notes (Addendum)
12/17/2014  [facility] was contacted by PCCM this morning to assume medical care of this patient on 12/18/2014.  [facility] will assume care of the patient on 12/18/2014 at 7:00AM as per the usual PCCM > [facility] transfer protocol.    Cherene Altes, MD [facility] For Consults/Admissions - Flow Manager [phone] Office  [phone]  Contact MD directly via text page:      [email or web address]      password [facility]

## 2014-12-17 NOTE — Progress Notes (Signed)
Called by bedside RN since patient is struggling from a respiratory standpoint.  I examined patient, clearly not protecting his airway.  Clearly developing respiratory failure.  I spoke with the entire family and to the patient separately.  They are all in agreement that patient would not want intubation.  He would like to be comfortable.  Will make patient a full DNR at this point, frequent NTS and ok to transfer.  Ok to place PICC if primary wants it.  PCCM will sign off.  The patient is critically ill with multiple organ systems failure and requires high complexity decision making for assessment and support, frequent evaluation and titration of therapies, application of advanced monitoring technologies and extensive interpretation of multiple databases.   Critical Care Time devoted to patient care services described in this note is  35  Minutes. This time reflects time of care of this signee Dr Jennet Maduro. This critical care time does not reflect procedure time, or teaching time or supervisory time of PA/NP/Med student/Med Resident etc but could involve care discussion time.  Rush Farmer, M.D. [facility] Pulmonary/Critical Care Medicine. Pager: [phone]. After hours pager: [phone].

## 2014-12-17 NOTE — Progress Notes (Signed)
Patient ID: Jay Lester, male   DOB: 07-05-1923, 81 y.o.   MRN: [id number] 9 Days Post-Op  Subjective: Pt with no complaints.  Had 2 episodes of flatus yesterday.  No BM  Objective: Vital signs in last 24 hours: Temp:  [98.1 F (36.7 C)-99 F (37.2 C)] 99 F (37.2 C) (08/30 2300) Pulse Rate:  [79-100] 85 (08/31 0700) Resp:  [16-27] 24 (08/31 0700) BP: (118-179)/(65-95) 161/76 mmHg (08/31 0700) SpO2:  [91 %-99 %] 97 % (08/31 0700) Last BM Date: 12/07/14  Intake/Output from previous day: 08/30 0701 - 08/31 0700 In: 4880 [I.V.:4530; IV Piggyback:350] Out: 2340 [Urine:1590; Emesis/NG output:750] Intake/Output this shift:    PE: Abd: soft, NT, ND, few BS, NGT with some bilious output (750cc/day)  Lab Results:   Recent Labs  12/15/14 0250  WBC 15.7*  HGB 9.4*  HCT 28.5*  PLT 160   BMET  Recent Labs  12/15/14 0250 12/16/14 0458 12/17/14 0415  NA 138 141 143  K 4.4  --   --   CL 105  --   --   CO2 27  --   --   GLUCOSE 115*  --   --   BUN 22*  --   --   CREATININE 1.19  --   --   CALCIUM 8.3*  --   --    PT/INR No results for input(s): LABPROT, INR in the last 72 hours. CMP     Component Value Date/Time   NA 143 12/17/2014 0415   K 4.4 12/15/2014 0250   CL 105 12/15/2014 0250   CO2 27 12/15/2014 0250   GLUCOSE 115* 12/15/2014 0250   BUN 22* 12/15/2014 0250   CREATININE 1.19 12/15/2014 0250   CREATININE 1.19 10/30/2012 0725   CALCIUM 8.3* 12/15/2014 0250   PROT 6.5 11/24/2014 1612   ALBUMIN 4.1 11/24/2014 1612   AST 21 12/07/2014 1612   ALT 14* 11/28/2014 1612   ALKPHOS 45 12/05/2014 1612   BILITOT 0.9 12/15/2014 1612   GFRNONAA 52* 12/15/2014 0250   GFRAA >60 12/15/2014 0250   Lipase  No results found for: LIPASE     Studies/Results: Dg Abd Portable 1v  12/16/2014   CLINICAL DATA:  Ileus. Small-bowel obstruction described on abdomen x-ray dated 12/15/2014.  EXAM: PORTABLE ABDOMEN - 1 VIEW  COMPARISON:  Abdominal x-rays dated 12/15/2014 and  12/13/2014.  FINDINGS: Single portable supine plain film examination of the abdomen is provided. Enteric tube remains adequately positioned with tip in the stomach. The side port of the nasogastric tube remains below the expected location of the gastroesophageal junction.  The dilated gas-filled loops of small bowel are again noted and are not significantly changed compared to the multiple prior studies. No free intraperitoneal air identified.  IMPRESSION: 1. Nasogastric tube remains in satisfactory position in the stomach. 2. Persistent small bowel dilatation without significant change, compatible with small bowel obstruction or ileus.   Electronically Signed   By: Franki Cabot M.D.   On: 12/16/2014 10:25   Dg Abd Portable 1v  12/15/2014   CLINICAL DATA:  Small bowel obstruction. Evaluate NG tube placement.  EXAM: PORTABLE ABDOMEN - 1 VIEW  COMPARISON:  12/14/2014.  FINDINGS: There is an NG tube with tip in the stomach. The side port for the NG tube is below the GE junction. Dilated loops of small bowel are again noted and appear unchanged from previous exam. Enteric contrast material is identified within small bowel loops. No contrast identified within the colon.  IMPRESSION: 1. NG tube in satisfactory position within the stomach. 2. Persistent small bowel dilatation compatible with small bowel obstruction. Enteric contrast material remains within the small bowel.   Electronically Signed   By: Kerby Moors M.D.   On: 12/15/2014 09:19    Anti-infectives: Anti-infectives    Start     Dose/Rate Route Frequency Ordered Stop   12/14/14 1200  vancomycin (VANCOCIN) IVPB 1000 mg/200 mL premix     1,000 mg 200 mL/hr over 60 Minutes Intravenous Every 24 hours 12/14/14 1058     12/13/14 1200  piperacillin-tazobactam (ZOSYN) IVPB 3.375 g     3.375 g 12.5 mL/hr over 240 Minutes Intravenous Every 8 hours 12/13/14 1119     12/13/14 1130  vancomycin (VANCOCIN) 1,250 mg in sodium chloride 0.9 % 250 mL IVPB      1,250 mg 166.7 mL/hr over 90 Minutes Intravenous  Once 12/13/14 1120 12/13/14 1451   11/20/2014 2300  ceFAZolin (ANCEF) IVPB 2 g/50 mL premix     2 g 100 mL/hr over 30 Minutes Intravenous 3 times per day 12/05/2014 1823 12/09/14 0534   12/07/2014 1830  ceFAZolin (ANCEF) NICU IV syringe 100 mg/mL  Status:  Discontinued     2,000 mg 240 mL/hr over 5 Minutes Intravenous Every 8 hours 12/06/2014 1816 11/21/2014 1823   11/19/2014 1530  bacitracin 50,000 Units in sodium chloride irrigation 0.9 % 500 mL irrigation  Status:  Discontinued       As needed 12/03/2014 1530 11/24/2014 1644   12/02/2014 1500  cefTRIAXone (ROCEPHIN) 1 g in dextrose 5 % 50 mL IVPB     1 g 100 mL/hr over 30 Minutes Intravenous To Neuro OR-Station #32 12/16/2014 1458 12/12/2014 1507   12/11/2014 2300  [MAR Hold]  ceFAZolin (ANCEF) IVPB 2 g/50 mL premix     (MAR Hold since 12/17/2014 1316)  Comments:  Send with patient to or   2 g 100 mL/hr over 30 Minutes Intravenous  Once 12/11/2014 2214 11/20/2014 1451       Assessment/Plan  1. Ileus -patient's films yesterday do not show much improvement, but he still has air and stool in his colon.  He passed some flatus yesterday.   -give 3 more doses of reglan today. -fleets enema -cont NGT and bowel rest. Hopefully we can remove his NGT soon.   -patient has never had abdominal surgery before and his films and clinical presentation are more c/w ileus than bowel obstruction.  Cont conservative management.  LOS: 11 days    [name] 12/17/2014, 8:02 AM Pager: [phone]

## 2014-12-17 NOTE — Progress Notes (Signed)
ANTIBIOTIC CONSULT NOTE - FOLLOW UP  Pharmacy Consult for Vancomycin and Zosyn Indication: aspiration pneumonia  No Known Allergies  Patient Measurements: Height: 5\' 7"  (170.2 cm) Weight: 141 lb 12.1 oz (64.3 kg) IBW/kg (Calculated) : 66.1  Vital Signs: Temp: 97.4 F (36.3 C) (08/31 0836) Temp Source: Oral (08/31 0836) BP: 157/88 mmHg (08/31 1000) Pulse Rate: 89 (08/31 1000) Intake/Output from previous day: 08/30 0701 - 08/31 0700 In: 4990 [I.V.:4640; IV Piggyback:350] Out: 2340 [Urine:1590; Emesis/NG output:750] Intake/Output from this shift: Total I/O In: 330 [I.V.:330] Out: 450 [Urine:450]  Labs:  Recent Labs  12/15/14 0250  WBC 15.7*  HGB 9.4*  PLT 160  CREATININE 1.19   Estimated Creatinine Clearance: 38.3 mL/min (by C-G formula based on Cr of 1.19).  Microbiology: Recent Results (from the past 720 hour(s))  MRSA PCR Screening     Status: None   Collection Time: 12/07/14  4:32 AM  Result Value Ref Range Status   MRSA by PCR NEGATIVE NEGATIVE Final    Comment:        The GeneXpert MRSA Assay (FDA approved for NASAL specimens only), is one component of a comprehensive MRSA colonization surveillance program. It is not intended to diagnose MRSA infection nor to guide or monitor treatment for MRSA infections.   Culture, blood (routine x 2)     Status: None (Preliminary result)   Collection Time: 12/13/14 12:45 PM  Result Value Ref Range Status   Specimen Description BLOOD LEFT HAND  Final   Special Requests IN PEDIATRIC BOTTLE 1CC  Final   Culture NO GROWTH 3 DAYS  Final   Report Status PENDING  Incomplete  Culture, blood (routine x 2)     Status: None (Preliminary result)   Collection Time: 12/13/14 12:50 PM  Result Value Ref Range Status   Specimen Description BLOOD RIGHT HAND  Final   Special Requests IN PEDIATRIC BOTTLE 1CC  Final   Culture NO GROWTH 3 DAYS  Final   Report Status PENDING  Incomplete  Culture, respiratory (NON-Expectorated)      Status: None   Collection Time: 12/13/14  2:29 PM  Result Value Ref Range Status   Specimen Description TRACHEAL ASPIRATE  Final   Special Requests Normal  Final   Gram Stain   Final    MODERATE WBC PRESENT, PREDOMINANTLY PMN NO SQUAMOUS EPITHELIAL CELLS SEEN ABUNDANT YEAST Performed at [facility]    Culture   Final    MODERATE YEAST CONSISTENT WITH CANDIDA SPECIES Performed at [facility]    Report Status 12/16/2014 FINAL  Final   Assessment:   Day # 5 Vanc and Zosyn for aspiration pneumonia.   Tmax 99, WBC 15.7 (8/29). Blood cultures negative to date.    CXR 8/29 showed slight improvement in bilateral lobe infiltrates, no pleural effusion.  Extubated 8/28, currently has NG tube due to ileus.  Goal of Therapy:  Vancomycin trough level 15-20 mcg/ml appropriate Zosyn dose for renal function and infection  Plan:   Continue Vancomycin 1250 mg IV q24hrs.  Continue Zosyn 3.375 gm IV q8hrs (each over 4 hours).  Bmet and CBC in am.  Follow up final cultures, progress, length of therapy.  Will consider checking Vancomycin trough level if >7-8 days therapy expected or significant change in renal function.  Arty Baumgartner, [place] Pager: [phone] 12/17/2014,10:28 AM

## 2014-12-18 ENCOUNTER — Inpatient Hospital Stay (HOSPITAL_COMMUNITY): Payer: Medicare Other

## 2014-12-18 DIAGNOSIS — K9189 Other postprocedural complications and disorders of digestive system: Secondary | ICD-10-CM

## 2014-12-18 DIAGNOSIS — I6201 Nontraumatic acute subdural hemorrhage: Secondary | ICD-10-CM | POA: Diagnosis not present

## 2014-12-18 DIAGNOSIS — G40909 Epilepsy, unspecified, not intractable, without status epilepticus: Secondary | ICD-10-CM

## 2014-12-18 DIAGNOSIS — J690 Pneumonitis due to inhalation of food and vomit: Secondary | ICD-10-CM | POA: Diagnosis present

## 2014-12-18 DIAGNOSIS — K567 Ileus, unspecified: Secondary | ICD-10-CM | POA: Diagnosis present

## 2014-12-18 DIAGNOSIS — N179 Acute kidney failure, unspecified: Secondary | ICD-10-CM

## 2014-12-18 DIAGNOSIS — K913 Postprocedural intestinal obstruction: Secondary | ICD-10-CM

## 2014-12-18 DIAGNOSIS — Z7189 Other specified counseling: Secondary | ICD-10-CM

## 2014-12-18 LAB — CULTURE, BLOOD (ROUTINE X 2)
CULTURE: NO GROWTH
CULTURE: NO GROWTH

## 2014-12-18 LAB — CBC
HEMATOCRIT: 31.5 % — AB (ref 39.0–52.0)
HEMOGLOBIN: 10.2 g/dL — AB (ref 13.0–17.0)
MCH: 29.8 pg (ref 26.0–34.0)
MCHC: 32.4 g/dL (ref 30.0–36.0)
MCV: 92.1 fL (ref 78.0–100.0)
Platelets: 212 10*3/uL (ref 150–400)
RBC: 3.42 MIL/uL — ABNORMAL LOW (ref 4.22–5.81)
RDW: 15 % (ref 11.5–15.5)
WBC: 13.5 10*3/uL — AB (ref 4.0–10.5)

## 2014-12-18 LAB — MAGNESIUM: MAGNESIUM: 1.7 mg/dL (ref 1.7–2.4)

## 2014-12-18 LAB — BASIC METABOLIC PANEL
Anion gap: 8 (ref 5–15)
BUN: 10 mg/dL (ref 6–20)
CHLORIDE: 105 mmol/L (ref 101–111)
CO2: 28 mmol/L (ref 22–32)
Calcium: 8.1 mg/dL — ABNORMAL LOW (ref 8.9–10.3)
Creatinine, Ser: 0.84 mg/dL (ref 0.61–1.24)
GFR calc Af Amer: >60 mL/min (ref >60)
GFR calc non Af Amer: >60 mL/min (ref >60)
GLUCOSE: 98 mg/dL (ref 65–99)
POTASSIUM: 3.4 mmol/L — AB (ref 3.5–5.1)
Sodium: 141 mmol/L (ref 135–145)

## 2014-12-18 MED ORDER — LORAZEPAM 2 MG/ML IJ SOLN
0.5000 mg | INTRAMUSCULAR | Status: DC | PRN
  Administered 2014-12-18 – 2014-12-19 (×3): 0.5 mg via INTRAVENOUS
  Administered 2014-12-20: 1 mg via INTRAVENOUS
  Filled 2014-12-18 (×4): qty 1

## 2014-12-18 MED ORDER — MORPHINE SULFATE (PF) 2 MG/ML IV SOLN: 1.0000 mg | INTRAVENOUS | Status: DC | PRN

## 2014-12-18 MED ORDER — PIPERACILLIN-TAZOBACTAM 3.375 G IVPB
3.3750 g | Freq: Three times a day (TID) | INTRAVENOUS | Status: DC
  Administered 2014-12-18 – 2014-12-19 (×3): 3.375 g via INTRAVENOUS
  Filled 2014-12-18 (×5): qty 50

## 2014-12-18 MED ORDER — BOOST / RESOURCE BREEZE PO LIQD: 1.0000 | Freq: Three times a day (TID) | ORAL | Status: DC

## 2014-12-18 MED ORDER — SODIUM CHLORIDE 0.9 % IJ SOLN
10.0000 mL | Freq: Two times a day (BID) | INTRAMUSCULAR | Status: DC
  Administered 2014-12-18: 10 mL

## 2014-12-18 MED ORDER — SODIUM CHLORIDE 0.9 % IJ SOLN: 10.0000 mL | INTRAMUSCULAR | Status: DC | PRN

## 2014-12-18 MED ORDER — FUROSEMIDE 10 MG/ML IJ SOLN
40.0000 mg | Freq: Once | INTRAMUSCULAR | Status: AC
  Administered 2014-12-18: 40 mg via INTRAVENOUS
  Filled 2014-12-18: qty 4

## 2014-12-18 MED ORDER — VANCOMYCIN HCL IN DEXTROSE 1-5 GM/200ML-% IV SOLN
1000.0000 mg | INTRAVENOUS | Status: DC
  Administered 2014-12-19: 1000 mg via INTRAVENOUS
  Filled 2014-12-18: qty 200

## 2014-12-18 NOTE — Progress Notes (Signed)
Physical Therapy Treatment Patient Details Name: Jay Lester MRN: [id number] DOB: 04-Jul-1923 Today's Date: 12/18/2014    History of Present Illness 81 year old male with a symptomatic acute on chronic left frontal subdural hematoma; s/p evacuation of hematoma.    PT Comments    Progressing slowly, but participating well each treatment.  Emphasis on sitting balance, upright posture/ standing tolerance, progressive gait.  Follow Up Recommendations  CIR;Supervision/Assistance - 24 hour     Equipment Recommendations  Rolling walker with 5" wheels    Recommendations for Other Services Rehab consult     Precautions / Restrictions Precautions Precautions: Fall    Mobility  Bed Mobility Overal bed mobility: Needs Assistance Bed Mobility: Supine to Sit     Supine to sit: Mod assist;HOB elevated Sit to supine: Mod assist   General bed mobility comments: pt assisted to EOB with bridge and mod truncal assist up.  Rocking to build momentum to scoot with UE assist  Transfers Overall transfer level: Needs assistance   Transfers: Sit to/from Stand Sit to Stand: +2 physical assistance;Min assist         General transfer comment: cues for hand placement/technique./postural cues  Ambulation/Gait Ambulation/Gait assistance: Mod assist Ambulation Distance (Feet): 4 Feet (forward and back x2, marching in place total 20 steps) Assistive device: Rolling walker (2 wheeled) Gait Pattern/deviations: Step-through pattern;Decreased step length - right;Decreased step length - left;Shuffle     General Gait Details: shuffle steps with RW.  cues for posture and assist for support and use of the RW   Stairs            Wheelchair Mobility    Modified Rankin (Stroke Patients Only) Modified Rankin (Stroke Patients Only) Modified Rankin: Moderately severe disability     Balance Overall balance assessment: Needs assistance   Sitting balance-Leahy Scale: Fair     Standing  balance support: Bilateral upper extremity supported;Single extremity supported Standing balance-Leahy Scale: Poor                      Cognition Arousal/Alertness: Awake/alert Behavior During Therapy: WFL for tasks assessed/performed Overall Cognitive Status: Impaired/Different from baseline (but functional within treatment)     Current Attention Level: Selective   Following Commands: Follows one step commands consistently            Exercises      General Comments General comments (skin integrity, edema, etc.): HR in 110's t 143, sats dropped into lower 80's on 2L, but maintained mid 90's on 4L.      Pertinent Vitals/Pain Pain Assessment: No/denies pain    Home Living                      Prior Function            PT Goals (current goals can now be found in the care plan section) Acute Rehab PT Goals Patient Stated Goal: to get stronger PT Goal Formulation: With patient Time For Goal Achievement: 12/23/14 Potential to Achieve Goals: Good Progress towards PT goals: Progressing toward goals    Frequency  Min 3X/week    PT Plan Current plan remains appropriate    Co-evaluation             End of Session   Activity Tolerance: Patient tolerated treatment well;Patient limited by fatigue Patient left: in bed;with call bell/phone within reach;Other (comment) (for IV check)     Time: 1135-1200 PT Time Calculation (min) (ACUTE ONLY): 25 min  Charges:  $Gait Training: 8-22 mins $Therapeutic Activity: 8-22 mins                    G Codes:      Danessa Mensch, [name] 12/18/2014, 3:49 PM

## 2014-12-18 NOTE — Progress Notes (Signed)
Jay Lester - Stepdown/ICU TEAM Progress Note  Jay Lester WUJ:[id number] DOB: 1923-05-17 DOA: 11/27/2014 PCP: Jay Hampshire, MD  Admit HPI / Brief Narrative: Jay Lester is a 81 y.o. male (right handed) PMHx HTN, CAD native artery S/P CABG 1994, HLD, tubular adenoma, redundant colon.  Presents with two weeks of progressive subtle right hemiparesis. It started in his leg and progressed to his arm. No recent falls but he fell several months ago and sustained loss of consciousness. No seizure disorders. No mental status changes. No cognitive impairment per family. Not on blood thinners. Patient was diagnosed with SDH, and on 8/20 2S/P left craniotomy with evacuation of SDH. Postsurgery was complicated by seizures, aspiration pneumonia and ileus. Patient was started on appropriate antibiotics and antiseizure medication, however again suffered aspiration incident on 9/Lester and after family meeting was made hospice care.  HPI/Subjective: 9/Lester patient spontaneously opens eyes however in respiratory distress, unable to answer questions or follow commands. Increased WOB while on a nonrebreather    Assessment/Plan: SDH -8/22 S/P left craniotomy with evacuation SDH  -Hemoglobin stable, continue to monitor closely -Hospice  Seizure disorder -Continue Vimpat 100 mg BID -Continue Keppra 1000 mg BID -Continue Dilantin 100 mg TID -Hospice; although hospice care will continue seizure medication  Postop Ileus -Hospice; liberal feeding as tolerated  VDRF secondary to RLL aspiration PNA -Continue current antibiotics -Push pulm hygiene  - NTS BID or more for respiratory comfort  -Morphine Lester-2mg  q 3 hr PRN -Ativan 0.5-Lester mg q4hr PRN anxiety   Acute renal failure -Resolved  Goals of care -Spoke at length with wife and daughters concerning patient's new aspiration today. Reviewed all options regarding NG tube, PEG placement, continued respiratory support to include nasal cannula,  Ventimask, and nonrebreather. After discussion family felt that the patient would like to be made hospice, with liberal nutrition understanding that this would lead eventually to his death.  Code Status: Hospice Family Communication: Wife and daughters present at time of exam Disposition Plan: Hospice    Consultants: Dr.Benjamin Jared Lester (neurosurgery) Dr.McNeill Adrian Lester (neurology)     Procedure/Significant Events: 8/22 Left-sided craniotomy for evacuation of subdural hematoma 8/24 EEG; abnormal EEG; persistence of slowing probably does reflect in part prolonged/recurrent focal seizure activity, but also raises a question of underlying structural pathology OETT 8/27>> 8/28 8/27 Jay Lester response called respiratory distress 8/27 postop ileus   Culture BCx2 8/27>> UC8/27>> Sputum 8/27>>   Antibiotics: 8/27 vanc>> 8/27 pip-tazo>>  DVT prophylaxis: SCD   Devices    LINES / TUBES:  CVL left I J cvl 8/27>>    Continuous Infusions: . sodium chloride 10 mL/hr at 12/17/14 1700  . sodium chloride 100 mL/hr at 12/18/14 1400    Objective: VITAL SIGNS: Temp: 97.7 F (36.5 C) (09/01 1547) Temp Source: Oral (09/01 1547) BP: 166/83 mmHg (09/01 1600) Pulse Rate: 110 (09/01 1800) SPO2; FIO2:   Intake/Output Summary (Last 24 hours) at 12/18/14 1945 Last data filed at 12/18/14 1820  Gross per 24 hour  Intake   2810 ml  Output   1880 ml  Net    930 ml     Exam: General: patient spontaneously opens eyes however in respiratory distress, unable to answer questions or follow commands, positive Acute respiratory distress Eyes: negative scleral hemorrhage ENT: Negative Runny nose,  negative gingival bleeding, Neck:  Negative scars, masses, torticollis, lymphadenopathy, JVD Lungs: tachypnea, coarse breath sounds bilateral, suction thick bloody thick yellow secretions with food particles  Cardiovascular: Tachycardic, irregular rhythm without murmur  gallop or  rub normal S1 and S2 Abdomen:negative abdominal pain, negative dysphagia, nondistended, positive soft, bowel sounds, no rebound, no ascites, no appreciable mass Extremities: No significant cyanosis, clubbing, or edema bilateral lower extremities Psychiatric:  Unable to assess Neurologic:  Unable to assess      Data Reviewed: Basic Metabolic Panel:  Recent Labs Lab 12/13/14 1405 12/14/14 0233 12/15/14 0250 12/16/14 0458 12/17/14 0415 12/18/14 0244  NA 137 137 138 141 143 141  K 3.9 4.3 4.4  --   --  3.4*  CL 100* 105 105  --   --  105  CO2 30 25 27   --   --  28  GLUCOSE 144* 131* 115*  --   --  98  BUN 14 16 22*  --   --  10  CREATININE Lester.35* Lester.39* Lester.19  --   --  0.84  CALCIUM 8.4* 7.9* 8.3*  --   --  8.Lester*  MG Lester.4* Lester.9 2.0  --   --  Lester.7  PHOS 3.3 3.9 3.2  --   --   --    Liver Function Tests: No results for input(s): AST, ALT, ALKPHOS, BILITOT, PROT, ALBUMIN in the last 168 hours. No results for input(s): LIPASE, AMYLASE in the last 168 hours. No results for input(s): AMMONIA in the last 168 hours. CBC:  Recent Labs Lab 12/13/14 1405 12/14/14 0233 12/15/14 0250 12/18/14 0244  WBC 11.2* 14.7* 15.7* 13.5*  HGB 11.0* 9.6* 9.4* 10.2*  HCT 32.2* 28.9* 28.5* 31.5*  MCV 90.2 92.0 91.9 92.Lester  PLT 142* 129* 160 212   Cardiac Enzymes:  Recent Labs Lab 12/14/14 0233  TROPONINI <0.03   BNP (last 3 results) No results for input(s): BNP in the last 8760 hours.  ProBNP (last 3 results) No results for input(s): PROBNP in the last 8760 hours.  CBG:  Recent Labs Lab 12/16/14 0727 12/16/14 1247 12/16/14 1536 12/16/14 1944  GLUCAP 123* 123* 117* 103*    Recent Results (from the past 240 hour(s))  Culture, blood (routine x 2)     Status: None   Collection Time: 12/13/14 12:45 PM  Result Value Ref Range Status   Specimen Description BLOOD LEFT HAND  Final   Special Requests IN PEDIATRIC BOTTLE [place]  Final   Culture NO GROWTH 5 DAYS  Final   Report Status  12/18/2014 FINAL  Final  Culture, blood (routine x 2)     Status: None   Collection Time: 12/13/14 12:50 PM  Result Value Ref Range Status   Specimen Description BLOOD RIGHT HAND  Final   Special Requests IN PEDIATRIC BOTTLE 1CC  Final   Culture NO GROWTH 5 DAYS  Final   Report Status 12/18/2014 FINAL  Final  Culture, respiratory (NON-Expectorated)     Status: None   Collection Time: 12/13/14  2:29 PM  Result Value Ref Range Status   Specimen Description TRACHEAL ASPIRATE  Final   Special Requests Normal  Final   Gram Stain   Final    MODERATE WBC PRESENT, PREDOMINANTLY PMN NO SQUAMOUS EPITHELIAL CELLS SEEN ABUNDANT YEAST Performed at [facility]    Culture   Final    MODERATE YEAST CONSISTENT WITH CANDIDA SPECIES Performed at [facility]    Report Status 12/16/2014 FINAL  Final     Studies:  Recent x-ray studies have been reviewed in detail by the Attending Physician  Scheduled Meds:  Scheduled Meds: . amLODipine  10 mg Per Tube Daily  .  antiseptic oral rinse  7 mL Mouth Rinse QID  . chlorhexidine gluconate  15 mL Mouth Rinse BID  . feeding supplement  Lester Container Oral TID BM  . furosemide  40 mg Intravenous Once  . lacosamide (VIMPAT) IV  100 mg Intravenous Q12H  . levETIRAcetam  Lester,000 mg Intravenous Q12H  . pantoprazole (PROTONIX) IV  40 mg Intravenous Q24H  . phenytoin (DILANTIN) IV  100 mg Intravenous 3 times per day  . simvastatin  10 mg Oral QHS  . sodium chloride  10-40 mL Intracatheter Q12H    Time spent on care of this patient: 40 mins   Laray Corbit, Geraldo Docker , MD  [facility] Office  [phone] Pager - [phone]  On-Call/Text Page:      [email or web address]      password [email or web address]  If 7PM-7AM, please contact night-coverage [email or web address] Password [email or web address] 9/Lester/2016, 7:45 PM   LOS: 12 days   Care during the described time interval was provided by me .  I have reviewed this patient's available data, including medical history, events of  note, physical examination, and all test results as part of my evaluation. I have personally reviewed and interpreted all radiology studies.   Dia Crawford, MD [phone] Pager

## 2014-12-18 NOTE — Progress Notes (Signed)
Peripherally Inserted Central Catheter/Midline Placement  The IV Nurse has discussed with the patient and/or persons authorized to consent for the patient, the purpose of this procedure and the potential benefits and risks involved with this procedure.  The benefits include less needle sticks, lab draws from the catheter and patient may be discharged home with the catheter.  Risks include, but not limited to, infection, bleeding, blood clot (thrombus formation), and puncture of an artery; nerve damage and irregular heat beat.  Alternatives to this procedure were also discussed.  PICC/Midline Placement Documentation  PICC / Midline Double Lumen 49/44/96 PICC Left Basilic 44 cm 0 cm (Active)  Indication for Insertion or Continuance of Line Poor Vasculature-patient has had multiple peripheral attempts or PIVs lasting less than 24 hours 12/18/2014  2:00 PM  Exposed Catheter (cm) 0 cm 12/18/2014  2:00 PM  Dressing Change Due 12/25/14 12/18/2014  2:00 PM       Jule Economy Horton 12/18/2014, 2:51 PM

## 2014-12-18 NOTE — Progress Notes (Signed)
eLink Physician-Brief Progress Note Patient Name: Jay Lester DOB: [birth year] MRN: [id number]   Date of Service  12/18/2014  HPI/Events of Note  Increased work of breathing, upper airway rattling sound, started after eating  eICU Interventions  CXR now NTS suction now Lasix x1 dose      Intervention Category Major Interventions: Respiratory failure - evaluation and management  MCQUAID, DOUGLAS 12/18/2014, 5:58 PM

## 2014-12-18 NOTE — Progress Notes (Signed)
NT suctioned pt.  He had been eating red jello.  Suctioned back red thick secretions.  Pt 's breath sounds rhonchi.  After suctioning pt Sats dropped.  Gave breathing treatment and placed on venti mask at 40%.  Nurse and Dr. Ree Kida.

## 2014-12-18 NOTE — Progress Notes (Signed)
ANTIBIOTIC CONSULT NOTE - FOLLOW UP  Pharmacy Consult for Vancomycin and Zosyn Indication: HCAP  No Known Allergies  Patient Measurements: Height: 5\' 7"  (170.2 cm) Weight: 141 lb 12.1 oz (64.3 kg) IBW/kg (Calculated) : 66.1  Vital Signs: Temp: 97.7 F (36.5 C) (09/01 1547) Temp Source: Oral (09/01 1547) BP: 166/83 mmHg (09/01 1600) Pulse Rate: 110 (09/01 1800) Intake/Output from previous day: 08/31 0701 - 09/01 0700 In: 2290 [I.V.:2290] Out: 1675 [Urine:1675] Intake/Output from this shift:    Labs:  Recent Labs  12/18/14 0244  WBC 13.5*  HGB 10.2*  PLT 212  CREATININE 0.84   Estimated Creatinine Clearance: 54.2 mL/min (by C-G formula based on Cr of 0.84). No results for input(s): VANCOTROUGH, VANCOPEAK, VANCORANDOM, GENTTROUGH, GENTPEAK, GENTRANDOM, TOBRATROUGH, TOBRAPEAK, TOBRARND, AMIKACINPEAK, AMIKACINTROU, AMIKACIN in the last 72 hours.   Microbiology: Recent Results (from the past 720 hour(s))  MRSA PCR Screening     Status: None   Collection Time: 12/07/14  4:32 AM  Result Value Ref Range Status   MRSA by PCR NEGATIVE NEGATIVE Final    Comment:        The GeneXpert MRSA Assay (FDA approved for NASAL specimens only), is one component of a comprehensive MRSA colonization surveillance program. It is not intended to diagnose MRSA infection nor to guide or monitor treatment for MRSA infections.   Culture, blood (routine x 2)     Status: None   Collection Time: 12/13/14 12:45 PM  Result Value Ref Range Status   Specimen Description BLOOD LEFT HAND  Final   Special Requests IN PEDIATRIC BOTTLE 1CC  Final   Culture NO GROWTH 5 DAYS  Final   Report Status 12/18/2014 FINAL  Final  Culture, blood (routine x 2)     Status: None   Collection Time: 12/13/14 12:50 PM  Result Value Ref Range Status   Specimen Description BLOOD RIGHT HAND  Final   Special Requests IN PEDIATRIC BOTTLE 1CC  Final   Culture NO GROWTH 5 DAYS  Final   Report Status 12/18/2014 FINAL   Final  Culture, respiratory (NON-Expectorated)     Status: None   Collection Time: 12/13/14  2:29 PM  Result Value Ref Range Status   Specimen Description TRACHEAL ASPIRATE  Final   Special Requests Normal  Final   Gram Stain   Final    MODERATE WBC PRESENT, PREDOMINANTLY PMN NO SQUAMOUS EPITHELIAL CELLS SEEN ABUNDANT YEAST Performed at [facility]    Culture   Final    MODERATE YEAST CONSISTENT WITH CANDIDA SPECIES Performed at [facility]    Report Status 12/16/2014 FINAL  Final    Anti-infectives    Start     Dose/Rate Route Frequency Ordered Stop   12/14/14 1200  vancomycin (VANCOCIN) IVPB 1000 mg/200 mL premix  Status:  Discontinued     1,000 mg 200 mL/hr over 60 Minutes Intravenous Every 24 hours 12/14/14 1058 12/18/14 1931   12/13/14 1200  piperacillin-tazobactam (ZOSYN) IVPB 3.375 g  Status:  Discontinued     3.375 g 12.5 mL/hr over 240 Minutes Intravenous Every 8 hours 12/13/14 1119 12/18/14 1931   12/13/14 1130  vancomycin (VANCOCIN) 1,250 mg in sodium chloride 0.9 % 250 mL IVPB     1,250 mg 166.7 mL/hr over 90 Minutes Intravenous  Once 12/13/14 1120 12/13/14 1451   11/23/2014 2300  ceFAZolin (ANCEF) IVPB 2 g/50 mL premix     2 g 100 mL/hr over 30 Minutes Intravenous 3 times per day 11/20/2014 1823  12/09/14 0534   12/03/2014 1830  ceFAZolin (ANCEF) NICU IV syringe 100 mg/mL  Status:  Discontinued     2,000 mg 240 mL/hr over 5 Minutes Intravenous Every 8 hours 12/02/2014 1816 12/07/2014 1823   12/12/2014 1530  bacitracin 50,000 Units in sodium chloride irrigation 0.9 % 500 mL irrigation  Status:  Discontinued       As needed 12/13/2014 1530 12/04/2014 1644   11/26/2014 1500  cefTRIAXone (ROCEPHIN) 1 g in dextrose 5 % 50 mL IVPB     1 g 100 mL/hr over 30 Minutes Intravenous To Neuro OR-Station #32 12/01/2014 1458 11/29/2014 1507   11/28/2014 2300  [MAR Hold]  ceFAZolin (ANCEF) IVPB 2 g/50 mL premix     (MAR Hold since 11/27/2014 1316)  Comments:  Send with patient to or    2 g 100 mL/hr over 30 Minutes Intravenous  Once 12/05/2014 2214 12/04/2014 1451     Assessment: 81 yo M started on abx for aspiration PNA. Now day #6 of vancomycin and zosyn and will continue therapy but now treating HCAP. CXR on 9/1 showed possible coexistent PNA. Pt has been afebrile and WBC elevated at 13.5. SCr stable, CrCl ~60ml/min. UOP good.  Goal of Therapy:  Vancomycin trough level 15-20 mcg/ml  Resolution of infection  Plan:  Continue vancomycin 1g IV Q24 tomorrow Continue Zosyn 3.375 gm IV q8h (4 hour infusion) Monitor clinical picture, renal function, VT prn F/U LOT  Lashun Mccants J 12/18/2014,7:36 PM

## 2014-12-18 NOTE — Progress Notes (Signed)
10 Days Post-Op  Subjective: No complaints regarding abdomen Had BM with enema  Objective: Vital signs in last 24 hours: Temp:  [97.4 F (36.3 C)-99.1 F (37.3 C)] 98.3 F (36.8 C) (09/01 0400) Pulse Rate:  [85-99] 92 (09/01 0000) Resp:  [19-28] 22 (09/01 0000) BP: (125-179)/(50-106) 155/79 mmHg (09/01 0400) SpO2:  [88 %-99 %] 97 % (09/01 0000) Last BM Date: 12/16/14  Intake/Output from previous day: 08/31 0701 - 09/01 0700 In: 2190 [I.V.:2190] Out: 1675 [Urine:1675] Intake/Output this shift:    Abdomen soft, NT/ND  Lab Results:   Recent Labs  12/18/14 0244  WBC 13.5*  HGB 10.2*  HCT 31.5*  PLT 212   BMET  Recent Labs  12/17/14 0415 12/18/14 0244  NA 143 141  K  --  3.4*  CL  --  105  CO2  --  28  GLUCOSE  --  98  BUN  --  10  CREATININE  --  0.84  CALCIUM  --  8.1*   PT/INR No results for input(s): LABPROT, INR in the last 72 hours. ABG No results for input(s): PHART, HCO3 in the last 72 hours.  Invalid input(s): PCO2, PO2  Studies/Results: Dg Abd Portable 1v  12/16/2014   CLINICAL DATA:  Ileus. Small-bowel obstruction described on abdomen x-ray dated 12/15/2014.  EXAM: PORTABLE ABDOMEN - 1 VIEW  COMPARISON:  Abdominal x-rays dated 12/15/2014 and 12/13/2014.  FINDINGS: Single portable supine plain film examination of the abdomen is provided. Enteric tube remains adequately positioned with tip in the stomach. The side port of the nasogastric tube remains below the expected location of the gastroesophageal junction.  The dilated gas-filled loops of small bowel are again noted and are not significantly changed compared to the multiple prior studies. No free intraperitoneal air identified.  IMPRESSION: 1. Nasogastric tube remains in satisfactory position in the stomach. 2. Persistent small bowel dilatation without significant change, compatible with small bowel obstruction or ileus.   Electronically Signed   By: Franki Cabot M.D.   On: 12/16/2014 10:25     Anti-infectives: Anti-infectives    Start     Dose/Rate Route Frequency Ordered Stop   12/14/14 1200  vancomycin (VANCOCIN) IVPB 1000 mg/200 mL premix     1,000 mg 200 mL/hr over 60 Minutes Intravenous Every 24 hours 12/14/14 1058     12/13/14 1200  piperacillin-tazobactam (ZOSYN) IVPB 3.375 g     3.375 g 12.5 mL/hr over 240 Minutes Intravenous Every 8 hours 12/13/14 1119     12/13/14 1130  vancomycin (VANCOCIN) 1,250 mg in sodium chloride 0.9 % 250 mL IVPB     1,250 mg 166.7 mL/hr over 90 Minutes Intravenous  Once 12/13/14 1120 12/13/14 1451   11/23/2014 2300  ceFAZolin (ANCEF) IVPB 2 g/50 mL premix     2 g 100 mL/hr over 30 Minutes Intravenous 3 times per day 11/20/2014 1823 12/09/14 0534   11/22/2014 1830  ceFAZolin (ANCEF) NICU IV syringe 100 mg/mL  Status:  Discontinued     2,000 mg 240 mL/hr over 5 Minutes Intravenous Every 8 hours 12/06/2014 1816 12/15/2014 1823   11/18/2014 1530  bacitracin 50,000 Units in sodium chloride irrigation 0.9 % 500 mL irrigation  Status:  Discontinued       As needed 12/06/2014 1530 12/07/2014 1644   12/13/2014 1500  cefTRIAXone (ROCEPHIN) 1 g in dextrose 5 % 50 mL IVPB     1 g 100 mL/hr over 30 Minutes Intravenous To Neuro OR-Station #32 12/07/2014 1458 11/21/2014 1507  12/07/2014 2300  [MAR Hold]  ceFAZolin (ANCEF) IVPB 2 g/50 mL premix     (MAR Hold since 12/05/2014 1316)  Comments:  Send with patient to or   2 g 100 mL/hr over 30 Minutes Intravenous  Once 11/18/2014 2214 11/17/2014 1451      Assessment/Plan: s/p Procedure(s) with comments: CRANIOTOMY HEMATOMA EVACUATION SUBDURAL (Left) - left  Ileus  Continuing to improve. Will D/C NG and start liquids  LOS: 12 days    Trea Carnegie A 12/18/2014

## 2014-12-18 NOTE — Anesthesia Postprocedure Evaluation (Signed)
  Anesthesia Post-op Note  Patient: Jay Lester  Procedure(s) Performed: Procedure(s) with comments: CRANIOTOMY HEMATOMA EVACUATION SUBDURAL (Left) - left   Patient Location: NICU  Anesthesia Type:General  Level of Consciousness: Unresponsive  Airway and Oxygen Therapy:  Patient remains intubated per anesthesia plan  Post-op Pain: none  Post-op Assessment: Post-op Vital signs reviewed, Patient's Cardiovascular Status Stable, Respiratory Function Stable and Patent Airway LLE Motor Response: Purposeful movement LLE Sensation: Full sensation RLE Motor Response: Purposeful movement RLE Sensation: Full sensation      Post-op Vital Signs: stable  Last Vitals:  Filed Vitals:   12/18/14 1547  BP:   Pulse:   Temp: 36.5 C  Resp:     Complications: No apparent anesthesia complications

## 2014-12-18 NOTE — Progress Notes (Signed)
No acute events.  Had bowel movement.  NGT removed. AVSS Awake and alert, oriented x3 Moving all extremities well No drift Incision c/d/i with staples No acute neurosurgical issues Appreciate hospitalist service assuming care of patient with complex medical issues

## 2014-12-18 NOTE — Progress Notes (Signed)
Pt attempting to eat some jello and broth. Pt had been slightly labored with breathing but not in distress. RN did not note any coughing initially. Family remained with pt. Family then concerned that they could hear pt's secretions in throat while breathing. RN came to bedside and observed pt becoming more labored, pt congested and lungs rhonchus. Pt became diaphoretic, tachy 110s, sats 95%, RR high 20s-30. RN attempted to ask pt questions and pt was barely able to whisper name, was able to follow some commands but in worsening distress and lessened responsiveness. RT notified immediately. Elink called with update and orders received for stat chest xray, NTS, and a dose of lasix. RT arrived to bedside to NTS and give breathing treatment. RN instructed to involve triad MD. Dr. Sherral Hammers called, updated with situation at beside. Family (daughters and wife) called back to bedside to discuss pt's current status, options, and develop a plan of care. Pt was already a DNR. Family all agreed at bedside that pt would not want further aggressive measures and agreed to make pt comfort care and transfer to palliative floor. Emotional support given. Neurosurgeon updated with plan of care.

## 2014-12-18 NOTE — Progress Notes (Signed)
Patient transferred to 6N30. Patient refusing to wear oxygen at this time. SpO2 saturations in the low 80's at time of transport. Family and belongings transferred along with patient.

## 2014-12-18 DEATH — deceased

## 2014-12-19 DIAGNOSIS — Z515 Encounter for palliative care: Secondary | ICD-10-CM

## 2014-12-19 MED ORDER — HYDROCODONE-ACETAMINOPHEN 5-325 MG PO TABS
1.0000 | ORAL_TABLET | ORAL | Status: DC | PRN
Start: 2014-12-19 — End: 2014-12-19

## 2014-12-19 MED ORDER — MORPHINE SULFATE (PF) 2 MG/ML IV SOLN
1.0000 mg | INTRAVENOUS | Status: DC | PRN
  Administered 2014-12-19 – 2014-12-20 (×3): 1 mg via INTRAVENOUS
  Filled 2014-12-19 (×3): qty 1

## 2014-12-19 NOTE — Progress Notes (Signed)
[place] requested by family to come a be with family and offer prayer as pt transitioned to comfort care.  [place] will follow-up during day.  Prayer, emotional and grief support offered.

## 2014-12-19 NOTE — Consult Note (Cosign Needed)
Family meeting took place in consult room.  Attending are patients 3 daughters.  They are currently caring for Ms. Zuercher and request to gather information and then discuss with her as she is very frail and emotionally exhausted from the events of her husbands short illness and terminal decline.  Brief overview of Mr. Yawn current medical state and the events that led to this discussion of goals of care and wishes.  The family verbalized confirmation of the DNR status and agree that their father would not want and escalation of care. They are excepting that Mr. Dec[place] is in a terminal state and they verbalized that he "is ready to go".  They have spent many hours at the bedside with additional family members and loved ones and agree that comfort, dignity and symptom management are the goals of care they are requesting.  Introduced the philosophy of hospice and the benefit of symptom management and emotional support they can provide.  Mrs. Dini lives in [place], therefore the family request that referral for eligibility be made to [facility] for impatient, EOL care.  Mr. Rapaport currently requires IV medication to prevent seizure activity from the craniotomy and injury from SDH.  He is not able to protect his airway, therefore comfort feeds have been ordered, although he is to weak to request or take anything PO.  He has a large suture line with staples on the right side of the skull that is clean and dry, physical assessment does not imply any pain symptoms at this time.  Collaborative discussion with Dr. Hilma Favors regarding outcome of the family meeting symptom management orders completed, CSW Dominica notified of referral request.  Hopeful for transfer once eligibility is confirmed.  Prognosis is hours to days.  Kizzie Fantasia, RN-BC, MSN, [facility]

## 2014-12-19 NOTE — Progress Notes (Signed)
Patient now on full comfort measures, is in no acute distress, looks comfortable, multiple family member in room. Appreciate palliative care team input, awaiting to be discharged to [facility] [place].

## 2014-12-19 NOTE — Progress Notes (Signed)
Nutrition Brief Note  Chart reviewed. Pt now transitioning to comfort care.  No further nutrition interventions warranted at this time.  Please re-consult as needed.   Mehtab Dolberry A. Khamora Karan, RD, LDN, CDE Pager: [phone] After hours Pager: [phone]  

## 2014-12-19 NOTE — Care Management Important Message (Signed)
Important Message  Patient Details  Name: Jay Lester MRN: [id number] Date of Birth: February 05, 1924   Medicare Important Message Given:  Yes-fourth notification given    Delorse Lek 12/19/2014, 10:24 AM

## 2014-12-19 NOTE — Progress Notes (Signed)
   12/19/14 1020  Clinical Encounter Type  Visited With Patient and family together  Visit Type Initial  Referral From Chaplain  Spiritual Encounters  Spiritual Needs Other (Comment)  Stress Factors  Patient Stress Factors None identified  Family Stress Factors None identified  Chaplain followed up on visit from patient coming in the ER last night.

## 2014-12-19 NOTE — Progress Notes (Signed)
Inpatient Rehabilitation   I note that pt. Is receiving full comfort care.  I will sign off.    [place] Admissions Coordinator Cell [phone] Office [phone]

## 2014-12-19 NOTE — Progress Notes (Signed)
[facility] Progress Note  11 Days Post-Op  Subjective: Sleeping  Objective: Vital signs in last 24 hours: Temp:  [97.7 F (36.5 C)-98.3 F (36.8 C)] 98.3 F (36.8 C) (09/02 0600) Pulse Rate:  [89-112] 104 (09/02 0600) Resp:  [20-30] 22 (09/02 0600) BP: (124-174)/(64-96) 135/64 mmHg (09/02 0600) SpO2:  [86 %-96 %] 88 % (09/02 0600) FiO2 (%):  [40 %] 40 % (09/01 2000) Weight:  [74.208 kg (163 lb 9.6 oz)] 74.208 kg (163 lb 9.6 oz) (09/01 2154) Last BM Date: 12/17/14  Intake/Output from previous day: 09/01 0701 - 09/02 0700 In: 3110 [I.V.:2200; IV Piggyback:910] Out: 2805 [Urine:2805] Intake/Output this shift:    PE: Gen:  NAD, asleep  Lab Results:   Recent Labs  12/18/14 0244  WBC 13.5*  HGB 10.2*  HCT 31.5*  PLT 212   BMET  Recent Labs  12/17/14 0415 12/18/14 0244  NA 143 141  K  --  3.4*  CL  --  105  CO2  --  28  GLUCOSE  --  98  BUN  --  10  CREATININE  --  0.84  CALCIUM  --  8.1*   PT/INR No results for input(s): LABPROT, INR in the last 72 hours. CMP     Component Value Date/Time   NA 141 12/18/2014 0244   K 3.4* 12/18/2014 0244   CL 105 12/18/2014 0244   CO2 28 12/18/2014 0244   GLUCOSE 98 12/18/2014 0244   BUN 10 12/18/2014 0244   CREATININE 0.84 12/18/2014 0244   CREATININE 1.19 10/30/2012 0725   CALCIUM 8.1* 12/18/2014 0244   PROT 6.5 11/26/2014 1612   ALBUMIN 4.1 11/21/2014 1612   AST 21 12/15/2014 1612   ALT 14* 11/29/2014 1612   ALKPHOS 45 11/17/2014 1612   BILITOT 0.9 12/11/2014 1612   GFRNONAA >60 12/18/2014 0244   GFRAA >60 12/18/2014 0244   Lipase  No results found for: LIPASE     Studies/Results: Dg Chest Port 1 View  12/18/2014   CLINICAL DATA:  Acute respiratory failure.  Hypoxemia.  EXAM: PORTABLE CHEST - 1 VIEW  COMPARISON:  Single view of the chest 12/15/2014, 12/14/2014 and 12/13/2014.  FINDINGS: NG tube and left IJ catheter have been removed since the most recent chest x-ray. There is a new left  PICC in place. The catheter takes an unusual course as left IJ catheters seen on prior exams but the tip projects over the superior vena cava. There has been marked worsening of airspace disease bilaterally with small bilateral pleural effusions. There is cardiomegaly. Broken median sternotomy wires are unchanged.  IMPRESSION: Tip of left PICC projects over the superior vena cava. Unusual course of the catheter is the same as that seen on prior exams.  Cardiomegaly and bilateral airspace disease with effusions compatible with pulmonary edema. Coexistent pneumonia is possible.   Electronically Signed   By: Inge Rise M.D.   On: 12/18/2014 18:23   Dg Abd Portable 1v  12/18/2014   CLINICAL DATA:  Ileus, history hypertension, coronary artery disease  EXAM: PORTABLE ABDOMEN - 1 VIEW  COMPARISON:  Portable exam 0603 hours compared to 11/19/2014  FINDINGS: Persistent dilatation of small bowel loops in mid abdomen though decreased in number and caliber since previous exam consistent with improving ileus.  Nasogastric tube projects over proximal stomach.  Small amount gas in colon including rectum.  Bones demineralized with minimal biconvex scoliosis.  IMPRESSION: Improving ileus.   Electronically Signed   By: Lavonia Dana  M.D.   On: 12/18/2014 08:00    Anti-infectives: Anti-infectives    Start     Dose/Rate Route Frequency Ordered Stop   12/19/14 1500  vancomycin (VANCOCIN) IVPB 1000 mg/200 mL premix     1,000 mg 200 mL/hr over 60 Minutes Intravenous Every 24 hours 12/18/14 1952     12/18/14 2300  piperacillin-tazobactam (ZOSYN) IVPB 3.375 g     3.375 g 12.5 mL/hr over 240 Minutes Intravenous Every 8 hours 12/18/14 1952     12/14/14 1200  vancomycin (VANCOCIN) IVPB 1000 mg/200 mL premix  Status:  Discontinued     1,000 mg 200 mL/hr over 60 Minutes Intravenous Every 24 hours 12/14/14 1058 12/18/14 1931   12/13/14 1200  piperacillin-tazobactam (ZOSYN) IVPB 3.375 g  Status:  Discontinued     3.375  g 12.5 mL/hr over 240 Minutes Intravenous Every 8 hours 12/13/14 1119 12/18/14 1931   12/13/14 1130  vancomycin (VANCOCIN) 1,250 mg in sodium chloride 0.9 % 250 mL IVPB     1,250 mg 166.7 mL/hr over 90 Minutes Intravenous  Once 12/13/14 1120 12/13/14 1451   12/03/2014 2300  ceFAZolin (ANCEF) IVPB 2 g/50 mL premix     2 g 100 mL/hr over 30 Minutes Intravenous 3 times per day 11/17/2014 1823 12/09/14 0534   12/17/2014 1830  ceFAZolin (ANCEF) NICU IV syringe 100 mg/mL  Status:  Discontinued     2,000 mg 240 mL/hr over 5 Minutes Intravenous Every 8 hours 11/20/2014 1816 11/23/2014 1823   12/10/2014 1530  bacitracin 50,000 Units in sodium chloride irrigation 0.9 % 500 mL irrigation  Status:  Discontinued       As needed 12/14/2014 1530 11/23/2014 1644   12/05/2014 1500  cefTRIAXone (ROCEPHIN) 1 g in dextrose 5 % 50 mL IVPB     1 g 100 mL/hr over 30 Minutes Intravenous To Neuro OR-Station #32 11/18/2014 1458 11/18/2014 1507   11/22/2014 2300  [MAR Hold]  ceFAZolin (ANCEF) IVPB 2 g/50 mL premix     (MAR Hold since 12/13/2014 1316)  Comments:  Send with patient to or   2 g 100 mL/hr over 30 Minutes Intravenous  Once 12/04/2014 2214 11/28/2014 1451       Assessment/Plan Ileus after left sided craniotomy for evacuation of subdural hematoma -Family noted they are proceeding with Hospice, which is why he's on 6N -They say his abdomen is improved.  He's on clears, but having some difficulty swallowing.  Family wants full comfort care -No further interventions/consultations -Will sign off, call with questions/concerns    LOS: 13 days    Jay Lester 12/19/2014, 7:35 AM Pager: [phone]

## 2014-12-19 NOTE — Progress Notes (Signed)
Ativan given overnight for agitation Afebrile, VSS except for tachycardia in the low 100s this morning Arousable but somnolent, only moaning Following bilaterally Incision c/d/i with staples Patient is now comfort care Will sign off for now, please feel free to call with questions.

## 2015-01-17 NOTE — Progress Notes (Signed)
Noted patient unresponsive, no respiration, no pulse, no heartbeat. Patient expired at 0441, 2 RNs confirmed and pronounced. Md notified and wife notified. CDS notified with reference number [id number]. Family at bedside, awaiting more relatives to come.

## 2015-01-17 NOTE — Discharge Summary (Signed)
Discharge Summary  Jay Lester OVF:[id number] DOB: 1923/07/11  PCP: Lanette Hampshire, MD  Admit date: 27-Dec-2014 Time of Death :  4:41am on 2023-01-10  Time spent: <106mins   Discharge Diagnoses:  Active Hospital Problems   Diagnosis Date Noted  . Aspiration pneumonia due to regurgitated food   . Seizure disorder   . Acute renal failure syndrome   . Goals of care, counseling/discussion   . Ileus, postoperative   . Acute respiratory failure with hypoxia 12/13/2014  . Aspiration pneumonia 12/13/2014  . Subdural hematoma 12-27-2014    Resolved Hospital Problems   Diagnosis Date Noted Date Resolved  No resolved problems to display.      Filed Weights   12/27/14 2050 12/18/14 2154 2015/01/10 0524  Weight: 141 lb 12.1 oz (64.3 kg) 163 lb 9.6 oz (74.208 kg) 163 lb (73.936 kg)    History of present illness:  Jay Lester is a 81 y.o. male (right handed) who presents with two weeks of progressive subtle right hemiparesis. It started in his leg and progressed to his arm. No recent falls but he fell several months ago and sustained loss of consciousness. No seizure disorders. No mental status changes. No cognitive impairment per family. Not on blood thinners. He was admitted to neurosurgery initially, Patient was diagnosed with SDH, and on 27-Dec-2022 2S/P left craniotomy with evacuation of SDH. Postsurgery was complicated by seizures, aspiration pneumonia and ileus. Patient was started on appropriate antibiotics and antiseizure medication, and pulmonology critical care consulted as well, however again suffered aspiration incident on 9/1 and after family meeting was made hospice care. Patient passed away peacefully with family at bedside on January 10, 2023  Hospital Course:  Active Problems:   Subdural hematoma   Acute respiratory failure with hypoxia   Aspiration pneumonia   Aspiration pneumonia due to regurgitated food   Seizure disorder   Acute renal failure syndrome   Goals of care,  counseling/discussion   Ileus, postoperative  SDH -8/22 S/P left craniotomy with evacuation SDH  -Hospice  Seizure disorder -was treated with Vimpat 100 mg BID, Keppra 1000 mg BID, Dilantin 100 mg TID -Hospice;   Postop Ileus -liberal feeding as tolerated, Hospice;   VDRF secondary to RLL aspiration PNA -treated with antibiotics, Push pulm hygiene  - NTS BID or more for respiratory comfort  -Morphine 1-2mg  q 3 hr PRN -Ativan 0.5-1 mg q4hr PRN anxiety -hospice, comfort measure from 9/1   Acute renal failure -Resolved  Goals of care -Spoke at length with wife and daughters concerning patient's new aspiration today. Reviewed all options regarding NG tube, PEG placement, continued respiratory support to include nasal cannula, Ventimask, and nonrebreather. After discussion family felt that the patient would like to be made hospice, with liberal nutrition understanding that this would lead eventually to his death.  Code Status: Hospice Family Communication: Wife and daughters present at time of exam    Consultants: Dr.Benjamin Kevan Ny Ditty (neurosurgery) Dr.McNeill Adrian Prows (neurology) Pulmonary critical care Palliative care     Procedure/Significant Events: 8/22 Left-sided craniotomy for evacuation of subdural hematoma 8/24 EEG; abnormal EEG; persistence of slowing probably does reflect in part prolonged/recurrent focal seizure activity, but also raises a question of underlying structural pathology OETT 8/27>> 8/28 8/27 Robert response called respiratory distress 8/27 postop ileus   Culture BCx2 8/27>> UC8/27>> Sputum 8/27>>   Antibiotics: 8/27 vanc>>9/1 8/27 pip-tazo>>9/1  DVT prophylaxis: SCD   Devices   LINES / TUBES:  CVL left I J cvl 8/27>>   No Known Allergies  The results of significant diagnostics from this hospitalization (including imaging, microbiology, ancillary and laboratory) are listed below for reference.     Significant Diagnostic Studies: Ct Head Wo Contrast  12/10/2014   CLINICAL DATA:  New onset dysphasia.  EXAM: CT HEAD WITHOUT CONTRAST  TECHNIQUE: Contiguous axial images were obtained from the base of the skull through the vertex without intravenous contrast.  COMPARISON:  12/09/2014  FINDINGS: Skull and Sinuses:A left parietal craniotomy with underlying hemostatic material has a stable appearance. A subdural drain has been removed  Chronic right maxillary sinusitis with atelectasis and wall thickening.  Orbits: No acute abnormality.  Brain: Stable volume of residual blood clot in the left subdural space, closest to the vertex, up to 13 mm in thickness. Pneumocephalus has redistributed but has a similar volume and decreased mass effect on the left frontal lobe. Rightward midline shift measures 5 mm, stable from yesterday. There is no ventricular entrapment or evidence of acute infarct. Trace subarachnoid hemorrhage seen in posterior left frontal sulci, newly seen.  IMPRESSION: 1. Stable residual left subdural hematoma with 5 mm midline shift. 2. Trace posterior left frontal subarachnoid hemorrhage that is newly seen.   Electronically Signed   By: Monte Fantasia M.D.   On: 12/10/2014 07:23   Ct Head Wo Contrast  12/09/2014   CLINICAL DATA:  Followup subdural hematoma  EXAM: CT HEAD WITHOUT CONTRAST  TECHNIQUE: Contiguous axial images were obtained from the base of the skull through the vertex without intravenous contrast.  COMPARISON:  12/01/2014  FINDINGS: There is been interval craniotomy on the left with placement of a surgical drain. The majority of the previously seen subdural hematoma has been evacuated. Dense material is noted in the surgical bed which may be related hemostatic products. Air is also noted within the operative bed. The degree of midline shift is significantly decreased now measuring approximately 3 mm. The left lateral ventricle effacement has decreased significantly in the interval  from the prior exam. Diffuse basal ganglia calcifications are seen. No findings to suggest acute head ridge or acute infarction are noted. Diffuse atrophic changes are again noted.  IMPRESSION: Status post craniotomy with drain placement for a left subdural hematoma. The mass effect caused by the subdural hematoma has decreased significantly with evacuation. Diffuse dense material is noted likely related to hemostatic products. Correlation with the appearance of drainage fluid and surgical procedure is recommended.   Electronically Signed   By: Inez Catalina M.D.   On: 12/09/2014 07:59   Mr Brain Wo Contrast  12/10/2014   CLINICAL DATA:  81 year old male with recent evacuation of large left subdural hematoma, new onset abnormal speech since 0830 hours today. Initial encounter.  EXAM: MRI HEAD WITHOUT CONTRAST  TECHNIQUE: Multiplanar, multiecho pulse sequences of the brain and surrounding structures were obtained without intravenous contrast.  COMPARISON:  Head CTs without contrast 12/10/2014 - 11/27/2014  FINDINGS: Residual left lateral convexity subdural hematoma, heterogeneous signal intensity and superimposed small extradural sub craniotomy hematoma (series 11, image 12). Collectively these measure up to 16 mm in thickness. The subdural component is 12-14 mm. Small volume superimposed pneumocephalus is scattered bilaterally and appears stable.  Susceptibility artifact on diffusion related to the blood products. No parenchymal restricted diffusion or evidence of acute infarction. Major intracranial vascular flow voids are preserved except for the distal left vertebral artery which appears non dominant.  Stable mass effect on the left hemisphere with partial effacement of the left lateral ventricle and rightward midline shift of 5 mm.  No other extra-axial collection. Patent basilar cisterns. No new intracranial hemorrhage identified. No ventriculomegaly. Negative pituitary. Negative cervicomedullary junction and  visualized cervical spine.  Patchy bilateral nonspecific cerebral white matter T2 and FLAIR hyperintensity. T2 heterogeneity in the bilateral basal ganglia, appears to be a combination of perivascular spaces and occasional chronic lacunar infarcts.  Bone marrow signal within normal limits. Negative orbits soft tissues. Mild right maxillary sinus mucosal thickening. Left scalp hematoma.  IMPRESSION: 1. No acute infarct. Stable postoperative appearance of the brain compared to the CT from 0714 hours today. 2. Residual left subdural hematoma and small extradural collection underlying the craniotomy flap. Stable residual mass effect on the left hemisphere with 5 mm rightward midline shift. Stable pneumocephalus.   Electronically Signed   By: Genevie Ann M.D.   On: 12/10/2014 12:00   Dg Chest Port 1 View  12/18/2014   CLINICAL DATA:  Acute respiratory failure.  Hypoxemia.  EXAM: PORTABLE CHEST - 1 VIEW  COMPARISON:  Single view of the chest 12/15/2014, 12/14/2014 and 12/13/2014.  FINDINGS: NG tube and left IJ catheter have been removed since the most recent chest x-ray. There is a new left PICC in place. The catheter takes an unusual course as left IJ catheters seen on prior exams but the tip projects over the superior vena cava. There has been marked worsening of airspace disease bilaterally with small bilateral pleural effusions. There is cardiomegaly. Broken median sternotomy wires are unchanged.  IMPRESSION: Tip of left PICC projects over the superior vena cava. Unusual course of the catheter is the same as that seen on prior exams.  Cardiomegaly and bilateral airspace disease with effusions compatible with pulmonary edema. Coexistent pneumonia is possible.   Electronically Signed   By: Inge Rise M.D.   On: 12/18/2014 18:23   Dg Chest Port 1 View  12/15/2014   CLINICAL DATA:  Respiratory failure  EXAM: PORTABLE CHEST - 1 VIEW  COMPARISON:  Portable chest x-ray of December 14, 2014  FINDINGS: The lungs are  adequately inflated. Confluent alveolar opacities in the lower lobes bilaterally are slightly less conspicuous today. There is no significant pleural effusion. The heart is top-normal in size. The pulmonary vascularity is normal. The patient has undergone previous CABG. The sternal wires are all broken. This is not new.  The left internal jugular venous catheter tip projects over the proximal third of the SVC. There has been interval placement of an esophagogastric tube. The density of the stripe on the esophagogastric tube proximally suggests that there is overlap with an endotracheal tube but the tip of an endotracheal tube is not clearly evident.  IMPRESSION: 1. Slight interval improvement in the bilateral lower lobe infiltrates. There is no significant pleural effusion. 2. The support tubes are in reasonable position.   Electronically Signed   By: David  Martinique M.D.   On: 12/15/2014 07:41   Dg Chest Port 1 View  12/14/2014   CLINICAL DATA:  Abnormal respiration  EXAM: PORTABLE CHEST - 1 VIEW  COMPARISON:  12/14/2014  FINDINGS: Interval extubation.  Left central line remains in stable position.  Consolidation noted in both lower lobes, right greater than left, similar to prior study. Suspect small right pleural effusion. No definite visible left effusion. No acute bony abnormality. Heart is borderline in size. Prior CABG.  IMPRESSION: Continued bilateral lower lobe airspace opacities, right greater than left. Suspect small right effusion.   Electronically Signed   By: Rolm Baptise M.D.   On: 12/14/2014 15:18  Dg Chest Port 1 View  12/14/2014   CLINICAL DATA:  Respiratory failure.  EXAM: PORTABLE CHEST - 1 VIEW  COMPARISON:  1 day prior  FINDINGS: Endotracheal tube terminates 3.8 cm above carina. Left internal jugular line tip at mid SVC. Nasogastric tube extends beyond the inferior aspect of the film. Normal heart size. Probable small left pleural effusion. No pneumothorax. Mild pulmonary venous  congestion. Infrahilar and basilar airspace disease bilaterally. Similar, given differences in technique.  IMPRESSION: No change in lower lobe predominant airspace disease, suspicious for infection or aspiration.  Developing pulmonary venous congestion with a probable small left pleural effusion.   Electronically Signed   By: Abigail Miyamoto M.D.   On: 12/14/2014 09:29   Dg Chest Port 1 View  12/13/2014   CLINICAL DATA:  Endotracheal tube and central line placement.  EXAM: PORTABLE CHEST - 1 VIEW  COMPARISON:  12/13/2014; 12/10/2014  FINDINGS: Grossly unchanged borderline enlarged cardiac silhouette and mediastinal contours with atherosclerotic plaque within the thoracic aorta. Post median sternotomy CABG. Several fractured sternotomy wires are again incidentally noted.  Interval intubation with endotracheal tube overlying the tracheal air column with tip approximately 4.5 cm above the carina. Left jugular approach central venous catheter with tip projected over the superior cavoatrial junction. Enteric tube tip and side port project below the left hemidiaphragm. No pneumothorax.  Worsening bibasilar heterogeneous opacities, right greater than left. No definite pleural effusion or evidence of edema. Unchanged bones.  IMPRESSION: 1. Support apparatus as above.  No pneumothorax. 2. Worsening bibasilar heterogeneous opacities potentially atelectasis though worrisome for aspiration. Continued attention on follow-up is recommended.   Electronically Signed   By: Sandi Mariscal M.D.   On: 12/13/2014 11:22   Dg Chest Port 1 View  12/13/2014   CLINICAL DATA:  Cough.  EXAM: PORTABLE CHEST - 1 VIEW  COMPARISON:  12/10/2014  FINDINGS: There is patchy opacity at the right lung base. Bronchopneumonia is suspected. Milder similar opacity is noted to medial lung base which may reflect additional pneumonia or be due to atelectasis.  No convincing pulmonary edema.  No pleural effusion or pneumothorax.  Stable changes from previous  CABG surgery are noted. No mediastinal or hilar masses or convincing adenopathy.  IMPRESSION: 1. Since the prior exam, opacity in the lung bases, right greater the left, has increased. Right base opacity is consistent with bronchopneumonia. Left base opacity may reflect atelectasis or additional pneumonia. No pulmonary edema.   Electronically Signed   By: Lajean Manes M.D.   On: 12/13/2014 09:44   Dg Chest Port 1 View  12/10/2014   CLINICAL DATA:  Craniotomy for hematoma evacuation.  Hypertension.  EXAM: PORTABLE CHEST - 1 VIEW  COMPARISON:  12/05/2014  FINDINGS: Previous median sternotomy and CABG procedure. The heart size is normal. No pleural effusion or edema. Pulmonary vascular congestion is identified.  IMPRESSION: 1. Pulmonary vascular congestion.   Electronically Signed   By: Kerby Moors M.D.   On: 12/10/2014 14:05   Dg Abd Portable 1v  12/18/2014   CLINICAL DATA:  Ileus, history hypertension, coronary artery disease  EXAM: PORTABLE ABDOMEN - 1 VIEW  COMPARISON:  Portable exam 0603 hours compared to 11/19/2014  FINDINGS: Persistent dilatation of small bowel loops in mid abdomen though decreased in number and caliber since previous exam consistent with improving ileus.  Nasogastric tube projects over proximal stomach.  Small amount gas in colon including rectum.  Bones demineralized with minimal biconvex scoliosis.  IMPRESSION: Improving ileus.   Electronically Signed  By: Lavonia Dana M.D.   On: 12/18/2014 08:00   Dg Abd Portable 1v  12/16/2014   CLINICAL DATA:  Ileus. Small-bowel obstruction described on abdomen x-ray dated 12/15/2014.  EXAM: PORTABLE ABDOMEN - 1 VIEW  COMPARISON:  Abdominal x-rays dated 12/15/2014 and 12/13/2014.  FINDINGS: Single portable supine plain film examination of the abdomen is provided. Enteric tube remains adequately positioned with tip in the stomach. The side port of the nasogastric tube remains below the expected location of the gastroesophageal junction.  The  dilated gas-filled loops of small bowel are again noted and are not significantly changed compared to the multiple prior studies. No free intraperitoneal air identified.  IMPRESSION: 1. Nasogastric tube remains in satisfactory position in the stomach. 2. Persistent small bowel dilatation without significant change, compatible with small bowel obstruction or ileus.   Electronically Signed   By: Franki Cabot M.D.   On: 12/16/2014 10:25   Dg Abd Portable 1v  12/15/2014   CLINICAL DATA:  Small bowel obstruction. Evaluate NG tube placement.  EXAM: PORTABLE ABDOMEN - 1 VIEW  COMPARISON:  12/14/2014.  FINDINGS: There is an NG tube with tip in the stomach. The side port for the NG tube is below the GE junction. Dilated loops of small bowel are again noted and appear unchanged from previous exam. Enteric contrast material is identified within small bowel loops. No contrast identified within the colon.  IMPRESSION: 1. NG tube in satisfactory position within the stomach. 2. Persistent small bowel dilatation compatible with small bowel obstruction. Enteric contrast material remains within the small bowel.   Electronically Signed   By: Kerby Moors M.D.   On: 12/15/2014 09:19   Dg Abd Portable 1v  12/15/2014   CLINICAL DATA:  Nasogastric tube placement.  Initial encounter.  EXAM: PORTABLE ABDOMEN - 1 VIEW  COMPARISON:  Abdominal radiograph performed earlier today at 7:34 p.m.  FINDINGS: The patient's enteric tube is seen ending overlying the body of the stomach. Contrast is seen within the stomach and proximal small bowel; it has advanced somewhat since the prior study, though findings remain compatible with relatively high-grade small bowel obstruction. There is diffuse dilatation of small-bowel loops, and residual stool is seen within the colon. No free intra-abdominal air is seen, though evaluation for free air is limited on a single supine view.  Mild degenerative change is noted along the lower lumbar spine. The  patient is status post median sternotomy, with a chronically fractured inferior-most sternal wire.  IMPRESSION: 1. Enteric tube seen ending overlying the body of the stomach. 2. Contrast fills the stomach and proximal small bowel. It has advanced somewhat since the prior study, though diffuse dilatation of small bowel loops remains compatible with relatively high-grade small bowel obstruction.   Electronically Signed   By: Garald Balding M.D.   On: 12/15/2014 02:38   Dg Abd Portable 1v-small Bowel Obstruction Protocol-initial, 8 Hr Delay  12/14/2014   CLINICAL DATA:  Small bowel obstruction.  EXAM: PORTABLE ABDOMEN - 1 VIEW  COMPARISON:  12/13/2014  FINDINGS: There is continued evidence of multiple air-filled dilated small bowel loops over the central abdomen measuring up to 4.7 cm in diameter. Contrast is present over the region of the stomach and proximal small bowel. Minimal air within the colon. Remainder of the exam is unchanged.  IMPRESSION: Stable findings suggesting small bowel obstruction. Contrast present over the region of the stomach and proximal small bowel.   Electronically Signed   By: Marin Olp M.D.  On: 12/14/2014 19:49   Dg Abd Portable 1v  12/13/2014   CLINICAL DATA:  Orogastric tube placement.  EXAM: PORTABLE ABDOMEN - 1 VIEW  COMPARISON:  12/13/2014  FINDINGS: Enteric tube tip is located in the left upper quadrant consistent with location in the upper stomach. Persistent gas-filled distended small bowel loops consistent with small bowel obstruction. No radiopaque stones. Degenerative changes in the spine.  IMPRESSION: Enteric tube tip is in the left upper quadrant consistent with location in the upper stomach. Persistent gas-filled dilated small bowel consistent with obstruction.   Electronically Signed   By: Lucienne Capers M.D.   On: 12/13/2014 21:39   Dg Abd Portable 1v  12/13/2014   CLINICAL DATA:  Acute distention of the abdomen.  EXAM: PORTABLE ABDOMEN - 1 VIEW  COMPARISON:   None.  FINDINGS: NG tube is in place with the tip in the peripyloric region. There is gaseous distention of small bowel loops concerning for small bowel obstruction. No visible free air on this supine image. No organomegaly or suspicious calcification.  IMPRESSION: Dilated small bowel loops diffusely concerning for small bowel obstruction.   Electronically Signed   By: Rolm Baptise M.D.   On: 12/13/2014 13:57    Microbiology: No results found for this or any previous visit (from the past 240 hour(s)).   Labs: Basic Metabolic Panel: No results for input(s): NA, K, CL, CO2, GLUCOSE, BUN, CREATININE, CALCIUM, MG, PHOS in the last 168 hours. Liver Function Tests: No results for input(s): AST, ALT, ALKPHOS, BILITOT, PROT, ALBUMIN in the last 168 hours. No results for input(s): LIPASE, AMYLASE in the last 168 hours. No results for input(s): AMMONIA in the last 168 hours. CBC: No results for input(s): WBC, NEUTROABS, HGB, HCT, MCV, PLT in the last 168 hours. Cardiac Enzymes: No results for input(s): CKTOTAL, CKMB, CKMBINDEX, TROPONINI in the last 168 hours. BNP: BNP (last 3 results) No results for input(s): BNP in the last 8760 hours.  ProBNP (last 3 results) No results for input(s): PROBNP in the last 8760 hours.  CBG: No results for input(s): GLUCAP in the last 168 hours.     SignedFlorencia Reasons MD, PhD  [facility] 01/06/2015, 5:58 PM

## 2015-01-17 DEATH — deceased

## 2016-09-18 IMAGING — DX DG CHEST 2V
2 series · 2 of 2 positions shown · non-contrast
Comparison: PA and lateral chest 10/09/2007.

CLINICAL DATA: Right arm and leg numbness. Weakness. Symptoms
today.

EXAM:
CHEST  2 VIEW

[chest pa]
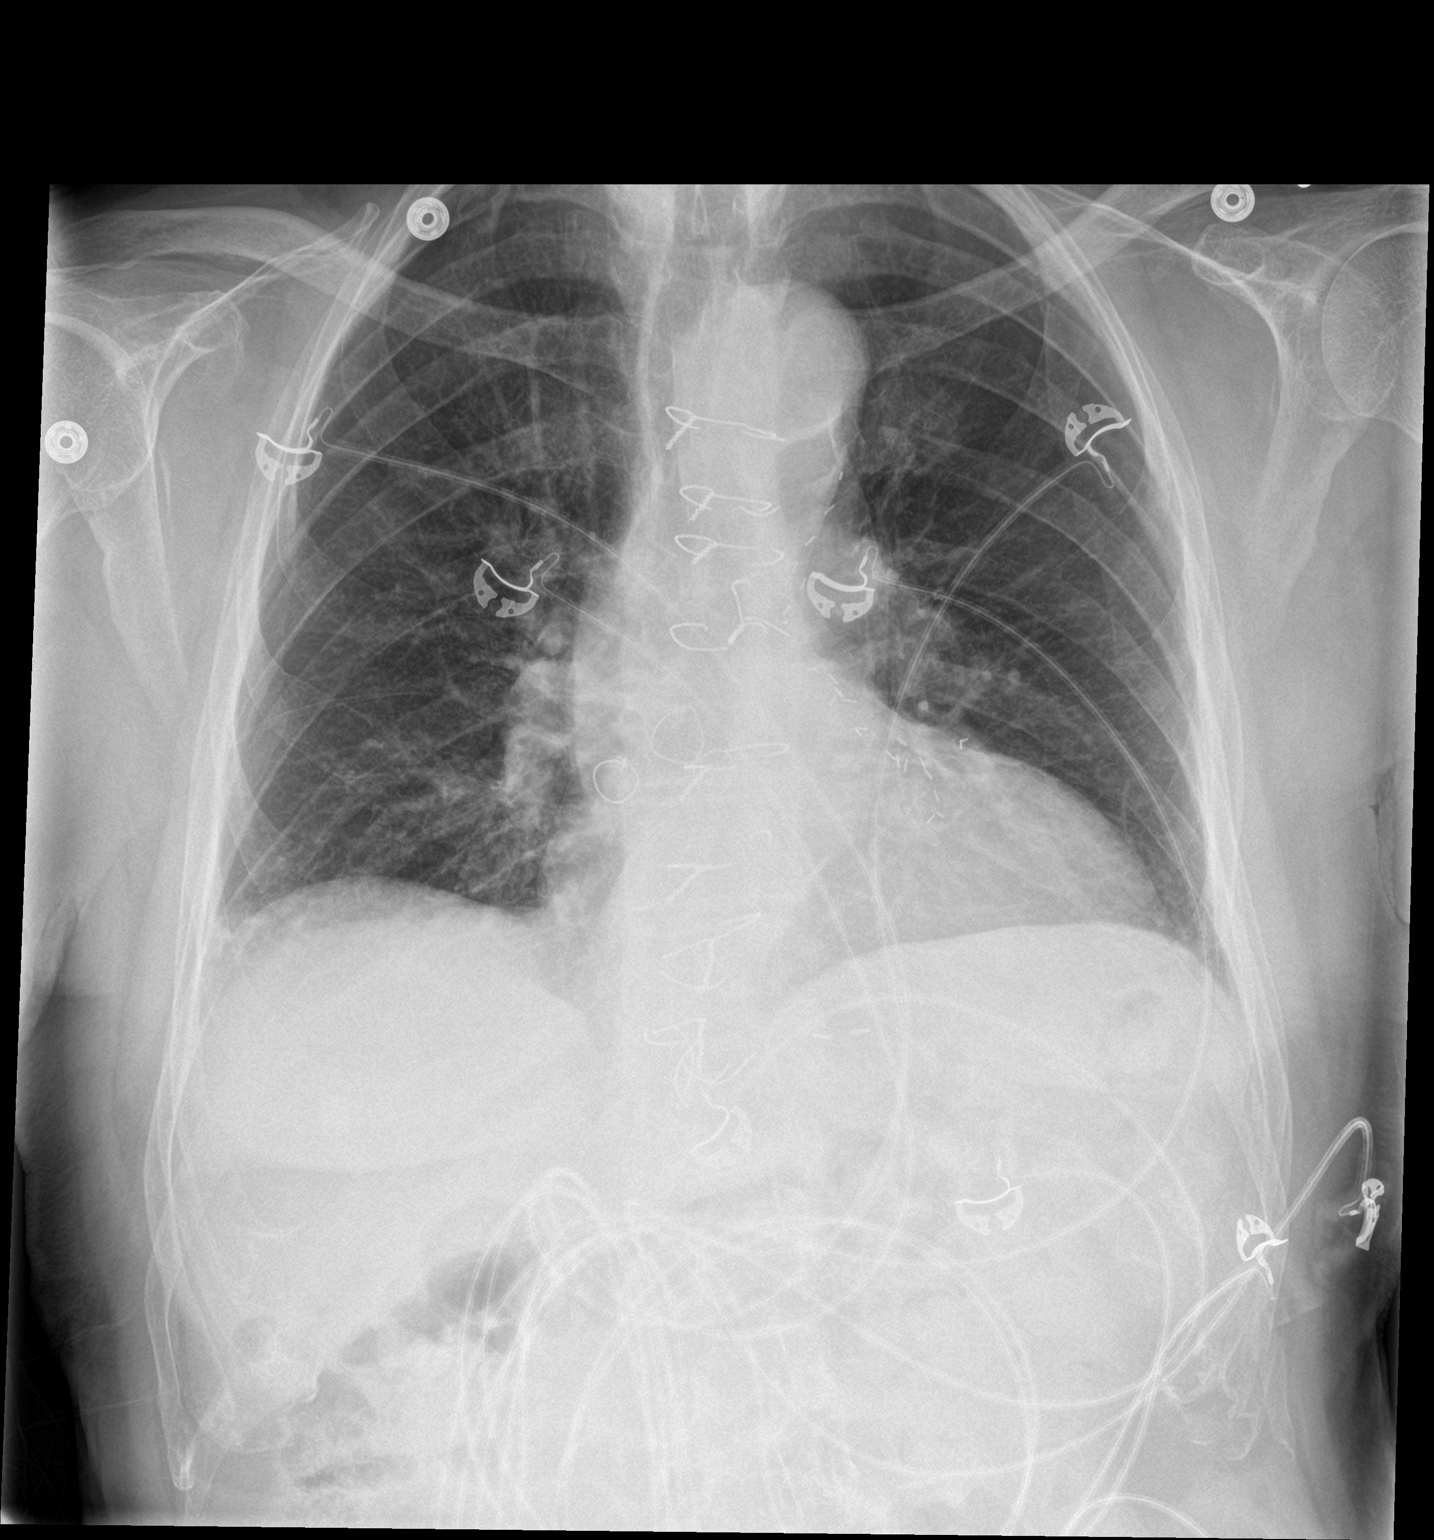

[chest lat]
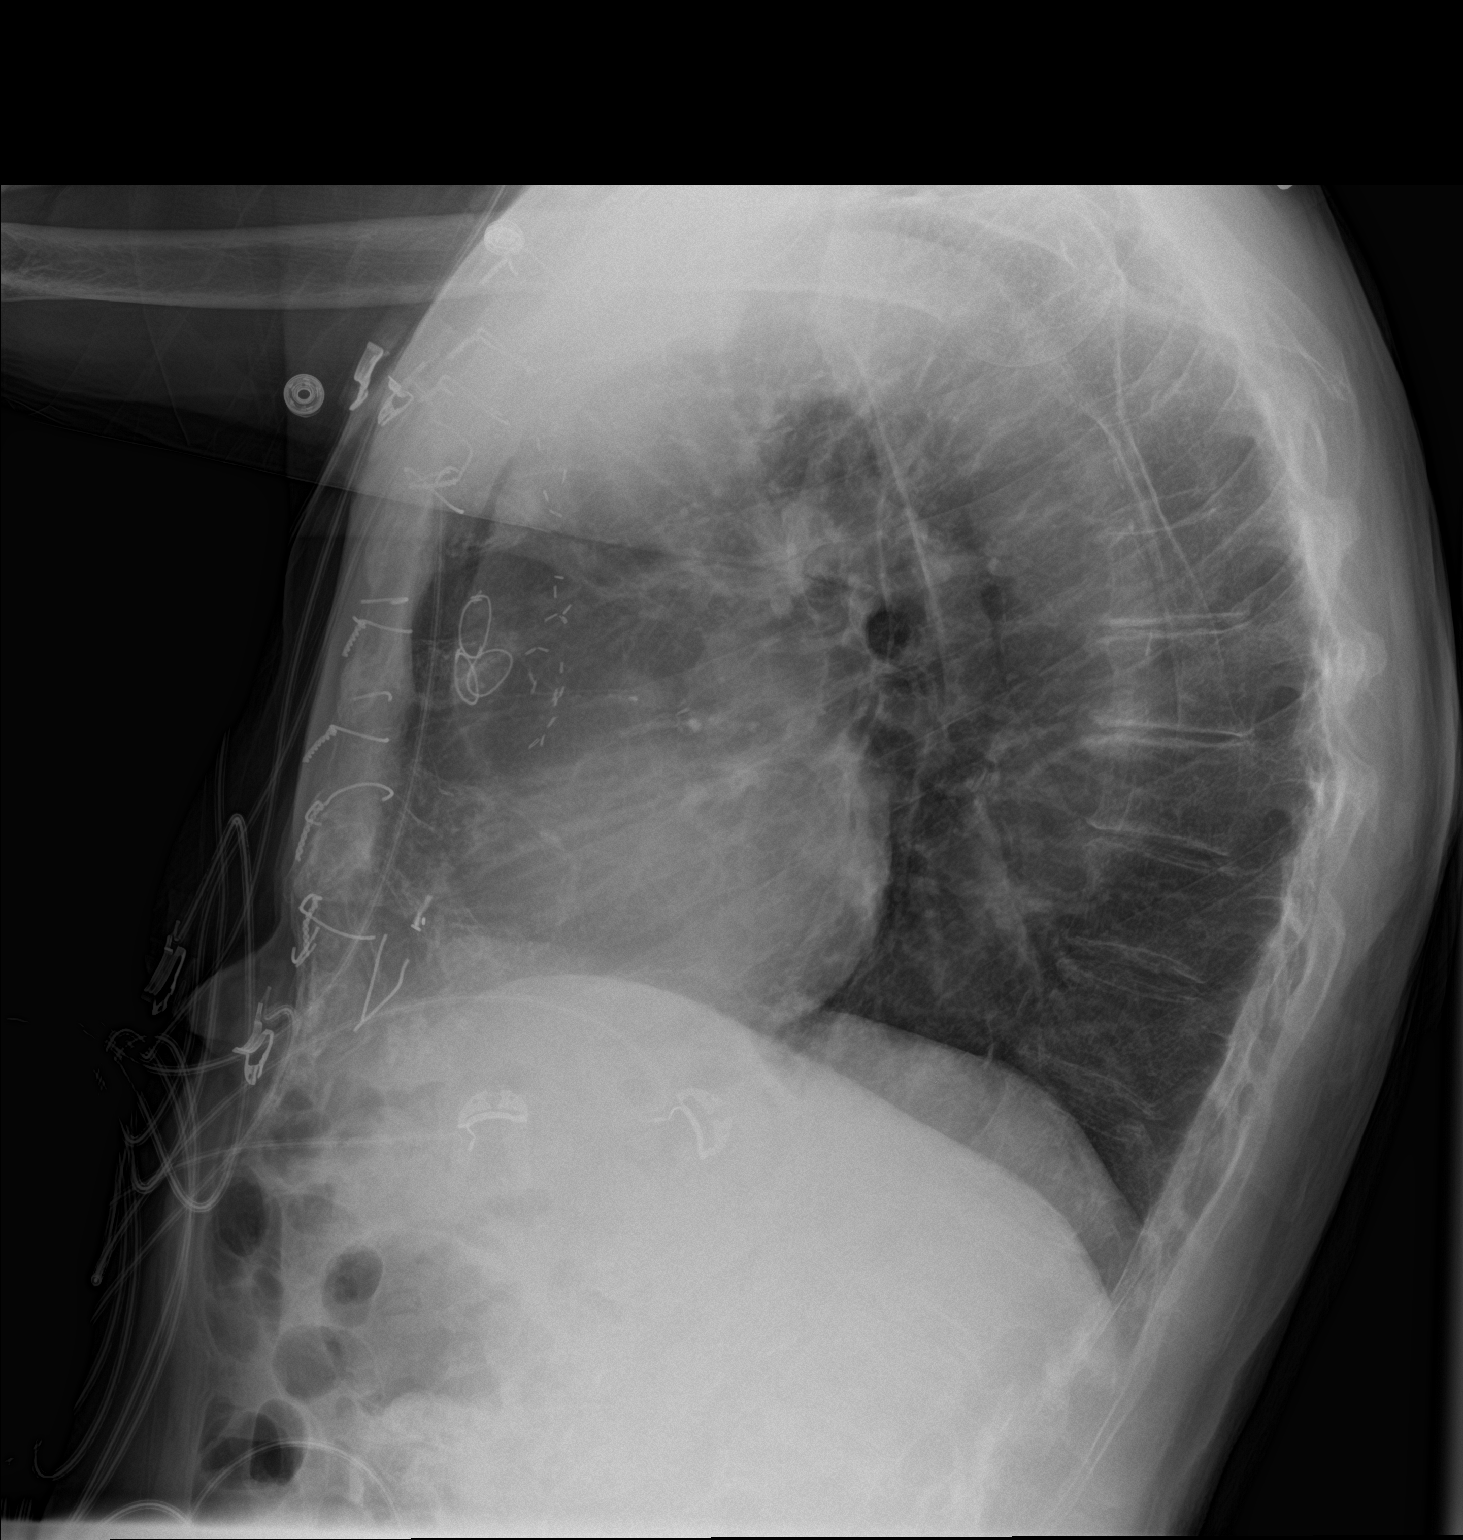

[2 of 2 positions shown; findings below may reference images not displayed]

FINDINGS: The patient is status post CABG. Heart size is upper normal. Lung
volumes are low with mild basilar atelectasis. No consolidative
process, pneumothorax or effusion.
IMPRESSION: No acute disease.

## 2016-09-18 IMAGING — CT CT HEAD W/O CM
1 series · 14 of 30 positions shown, 18 images · non-contrast
Comparison: None.

CLINICAL DATA: Acute onset of intermittent right arm numbness and
right leg pain, with severe diaphoresis. Generalized weakness and
stumbling around the house. Initial encounter.

EXAM:
CT HEAD WITHOUT CONTRAST
TECHNIQUE: Contiguous axial images were obtained from the base of the skull
through the vertex without intravenous contrast.

[Series 2: headseq 4.8 h37s · axial · 0.51mm/px · z∈[+139,+269]mm · 14 of 30 slices shown, 18 images]
[im 3/30  brain]
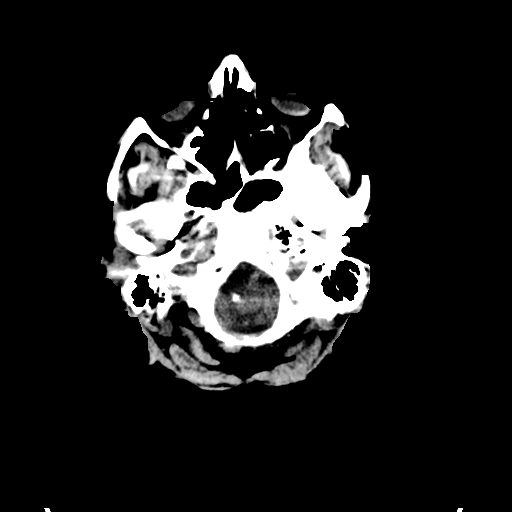
[im 3/30  bone]
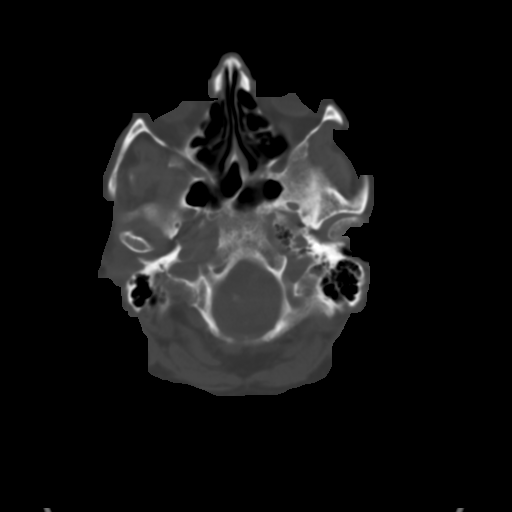
[im 5/30  brain]
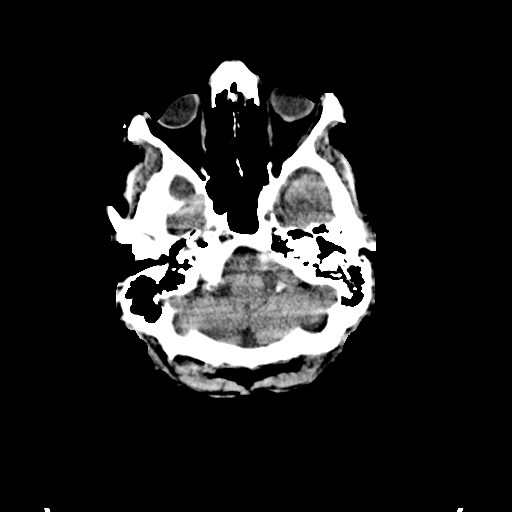
[im 7/30  brain]
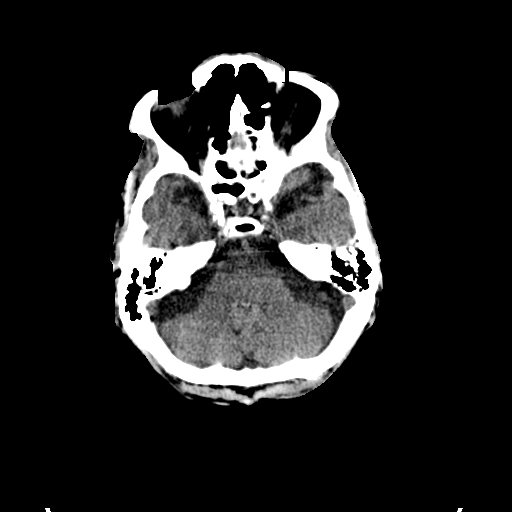
[im 9/30  brain]
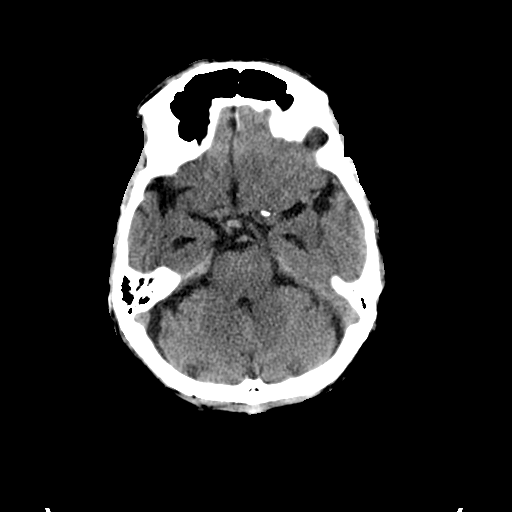
[im 11/30  brain]
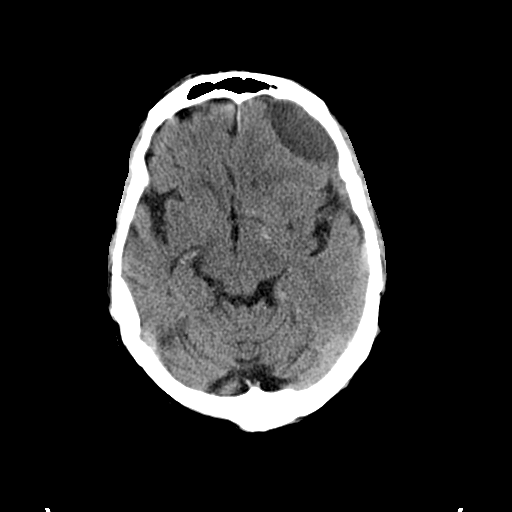
[im 11/30  bone]
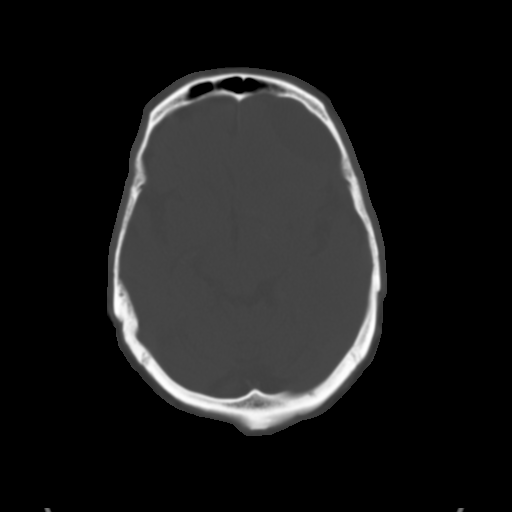
[im 13/30  brain]
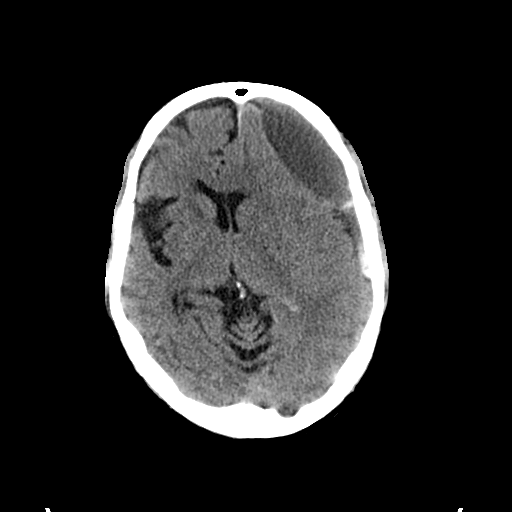
[im 15/30  brain]
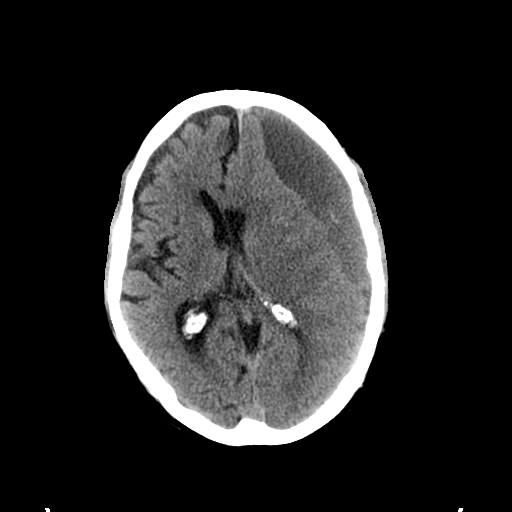
[im 17/30  brain]
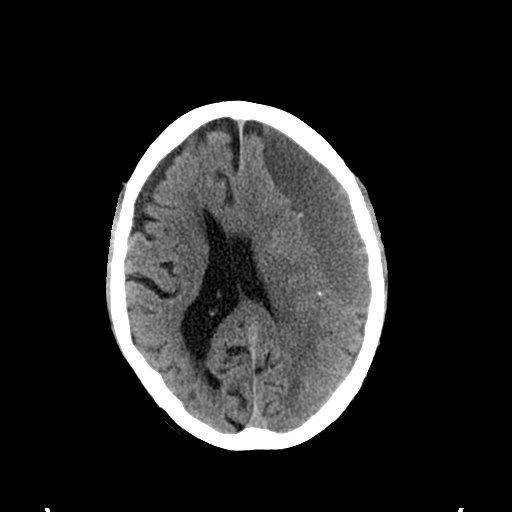
[im 19/30  brain]
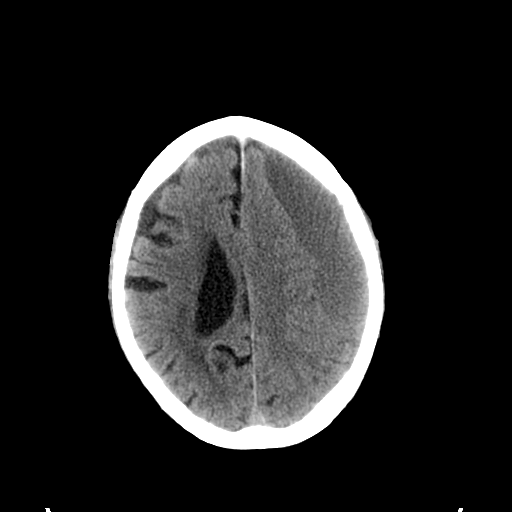
[im 19/30  bone]
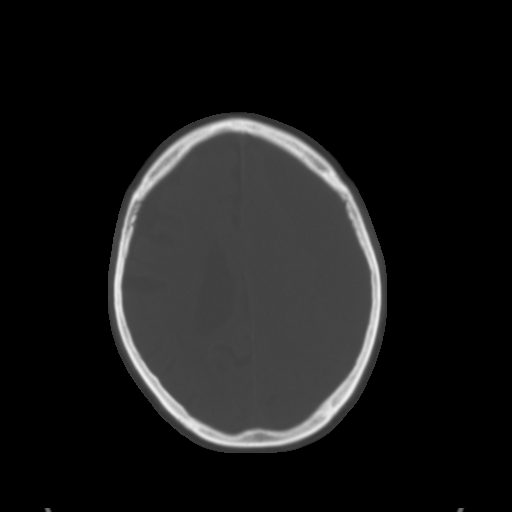
[im 21/30  brain]
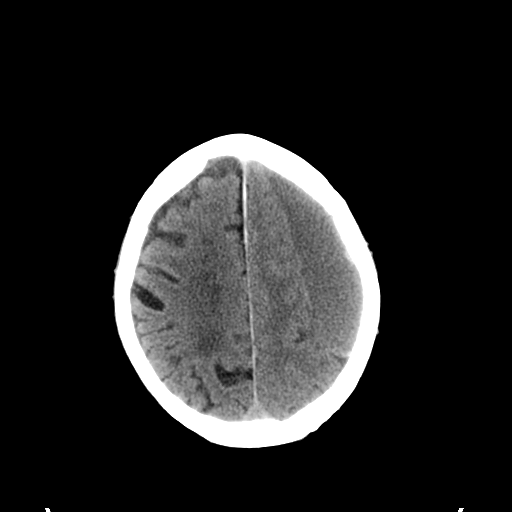
[im 23/30  brain]
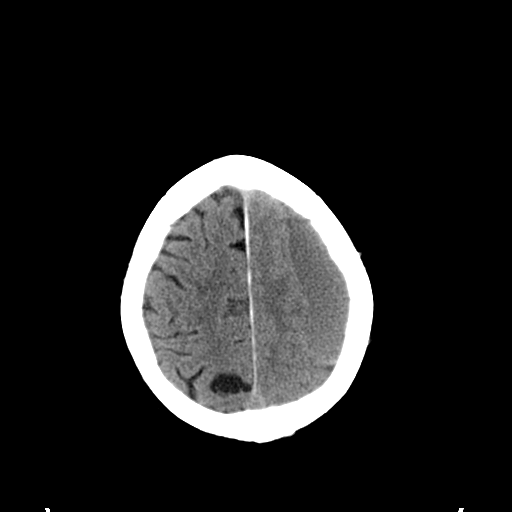
[im 25/30  brain]
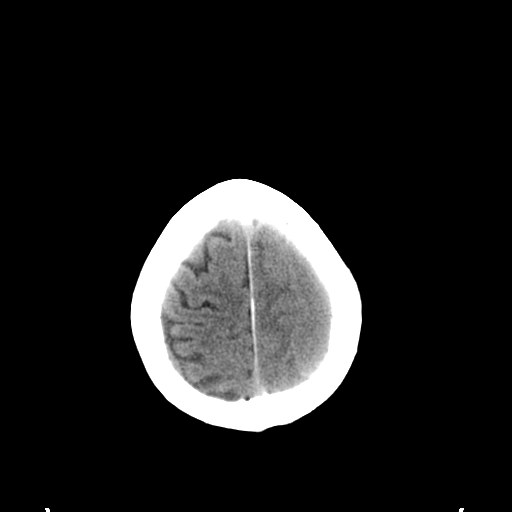
[im 27/30  brain]
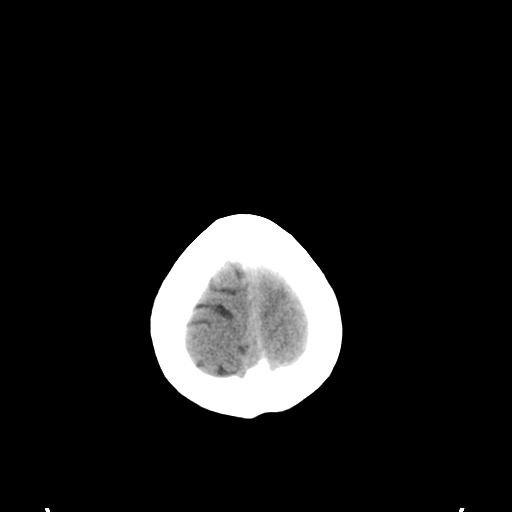
[im 27/30  bone]
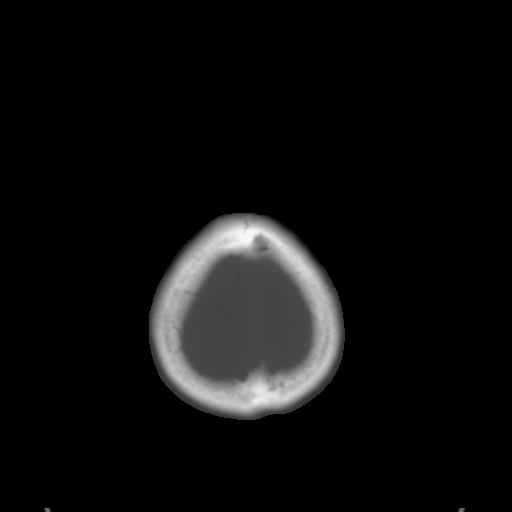
[im 29/30  brain]
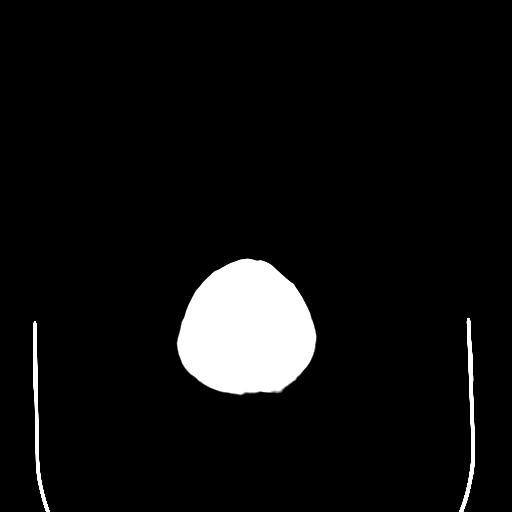

[14 of 30 positions shown; findings below may reference images not displayed]

FINDINGS: There is a large mostly chronic left-sided subdural hematoma,
measuring up to 2.9 cm in thickness, with scattered small acute
components of subdural hemorrhage. The convex shape of portions of
this hematoma reflect the chronic nature of the hematoma, with areas
of loculation. Significant associated mass effect is seen, with up
to 1.0 cm of rightward midline shift and subfalcine herniation. No
significant mass effect is noted at the level of the cerebral
peduncles or pons. The surrounding cisterns appear intact, without
evidence of transtentorial herniation.

There is underlying mild to moderate cortical volume loss and
cerebellar atrophy. The size of the right lateral ventricle is
thought to reflect the patient's baseline, with effacement of much
of the left lateral ventricle and third ventricle, but no definite
evidence of hydrocephalus. Scattered periventricular and subcortical
white matter change likely reflects small vessel ischemic
microangiopathy. Calcification is seen at the globus pallidus
bilaterally. The brainstem and fourth ventricle are within normal
limits.

There is no evidence of fracture; visualized osseous structures are
unremarkable in appearance. The orbits are within normal limits.
Mucosal thickening is noted at the right maxillary sinus. The
remaining paranasal sinuses and mastoid air cells are well-aerated.
No significant soft tissue abnormalities are seen.
IMPRESSION: 1. Large mostly chronic left-sided subdural hematoma, measuring up
to 2.9 cm in thickness, containing scattered small foci of acute
subdural hemorrhage. Significant associated mass effect, with up to
1.0 cm of rightward midline shift and subfalcine herniation. No
significant mass effect seen at the level of the cerebral peduncles
or pons; no evidence of transtentorial herniation at this time.
2. The size of the right lateral ventricle is thought to reflect the
patient's baseline, with effacement of much of the left lateral
ventricle and third ventricle, but no definite evidence of
hydrocephalus.
3. Underlying mild to moderate cortical volume loss and cerebellar
atrophy. Scattered small vessel ischemic microangiopathy.
4. Mucosal thickening at the right maxillary sinus.
Critical Value/emergent results were called by telephone at the time
of interpretation on 12/06/2014 at [DATE] to Dr. Sg, who
verbally acknowledged these results.
